# Patient Record
Sex: Female | Born: 1990 | Race: Black or African American | Hispanic: No | Marital: Married | Smoking: Never smoker
Health system: Southern US, Community
[De-identification: ages and names within clinical notes are randomized; demographics above are authoritative.]

## PROBLEM LIST (undated history)

## (undated) ENCOUNTER — Inpatient Hospital Stay (HOSPITAL_COMMUNITY): Payer: Self-pay

## (undated) DIAGNOSIS — Z789 Other specified health status: Secondary | ICD-10-CM

## (undated) HISTORY — DX: Other specified health status: Z78.9

---

## 2013-02-15 NOTE — L&D Delivery Note (Signed)
Delivery Note Went to complete dilation and was found to be crowning by RN.   At 2:03 PM a viable and healthy female was delivered via NSVD,  (Presentation LOA  APGAR: 8,9 ; weight: to be determined  Placenta status: intact, 3 vessel Cord, with no complications   Anesthesia:  None Episiotomy:  None Lacerations:  None Suture Repair: None Est. Blood Loss (mL):  200cc  Mom to postpartum.  Baby to Couplet care / Skin to Skin.    Benjamin Stainhompson, McKenzie L, MD 01/26/2014, 2:27 PM   Attended by me also Agree with note Baby born completely in the caul. Courtney SignsMarie L Dennie Howard, CNM

## 2013-10-15 ENCOUNTER — Inpatient Hospital Stay (HOSPITAL_COMMUNITY)
Admission: AD | Admit: 2013-10-15 | Discharge: 2013-10-15 | Disposition: A | Discharge status: Home / Self Care | Payer: Medicaid Other | Source: Ambulatory Visit | Attending: Obstetrics and Gynecology | Admitting: Obstetrics and Gynecology

## 2013-10-15 ENCOUNTER — Encounter (HOSPITAL_COMMUNITY): Payer: Self-pay | Admitting: *Deleted

## 2013-10-15 DIAGNOSIS — R109 Unspecified abdominal pain: Secondary | ICD-10-CM | POA: Diagnosis present

## 2013-10-15 DIAGNOSIS — Z3482 Encounter for supervision of other normal pregnancy, second trimester: Secondary | ICD-10-CM

## 2013-10-15 DIAGNOSIS — R1084 Generalized abdominal pain: Secondary | ICD-10-CM

## 2013-10-15 DIAGNOSIS — O0932 Supervision of pregnancy with insufficient antenatal care, second trimester: Secondary | ICD-10-CM

## 2013-10-15 DIAGNOSIS — O9989 Other specified diseases and conditions complicating pregnancy, childbirth and the puerperium: Principal | ICD-10-CM

## 2013-10-15 DIAGNOSIS — O0930 Supervision of pregnancy with insufficient antenatal care, unspecified trimester: Secondary | ICD-10-CM | POA: Diagnosis not present

## 2013-10-15 DIAGNOSIS — O99891 Other specified diseases and conditions complicating pregnancy: Secondary | ICD-10-CM | POA: Insufficient documentation

## 2013-10-15 LAB — URINALYSIS, ROUTINE W REFLEX MICROSCOPIC
Bilirubin Urine: NEGATIVE
Glucose, UA: NEGATIVE mg/dL
Hgb urine dipstick: NEGATIVE
Ketones, ur: NEGATIVE mg/dL
Leukocytes, UA: NEGATIVE
NITRITE: NEGATIVE
PH: 7 (ref 5.0–8.0)
Protein, ur: NEGATIVE mg/dL
Urobilinogen, UA: 0.2 mg/dL (ref 0.0–1.0)

## 2013-10-15 LAB — POCT PREGNANCY, URINE: PREG TEST UR: POSITIVE — AB

## 2013-10-15 NOTE — MAU Note (Signed)
5 months pregnant, no prenatal care.  L mid abd pain since 2011, worse with pregnancy, denies bleeding or LOF.

## 2013-10-15 NOTE — Discharge Instructions (Signed)
Please schedule an appointment at one of the listed clinics for routine prenatal care.

## 2013-10-15 NOTE — MAU Provider Note (Signed)
  History     CSN: [id number]  Arrival date and time: 10/15/13 1123   None     Chief Complaint  Patient presents with  . Abdominal Pain   HPI 23 year old G3P2002 at around 24.3 by last menstrual period who presents to the MAU to have her baby "checked on." Worried that the baby is low. History is obtained through an interpreter phone. She recently moved to the [place] and has not established prenatal care yet. Today she feels well and only complains of left side pain that she has had for over 4 years - unchanged from prior. +Fetal movement. Denies contractions, vaginal bleeding, loss of fluid or other concerning symptoms.    OB History   Grav Para Term Preterm Abortions TAB SAB Ect Mult Living   No past medical history on file.  Past Surgical History  Procedure Laterality Date  . Cesarean section      No family history on file.  History  Substance Use Topics  . Smoking status: Not on file  . Smokeless tobacco: Not on file  . Alcohol Use: Not on file    Allergies: No Known Allergies  No prescriptions prior to admission    Review of Systems  Constitutional: Negative.   HENT: Negative.   Eyes: Negative.   Respiratory: Negative.   Cardiovascular: Negative.   Gastrointestinal: Negative.   Genitourinary: Negative.   Musculoskeletal: Positive for back pain. Negative for falls and joint pain.  Skin: Negative.   Neurological: Negative.   Psychiatric/Behavioral: Negative.   All other systems reviewed and are negative.  Negative except as listed in HPI.  Physical Exam   Blood pressure 100/53, pulse 77, temperature 98.2 F (36.8 C), temperature source Oral, resp. rate 20, last menstrual period 04/20/2013.  Physical Exam  Nursing note and vitals reviewed. Constitutional: She is oriented to person, place, and time. She appears well-developed and well-nourished. No distress.  Eyes: Conjunctivae are normal.  Cardiovascular: Normal rate.    Respiratory: Effort normal.  GI: Soft. She exhibits no distension. There is no tenderness. There is no rebound and no guarding.  Genitourinary: Vagina normal.  SVE: Closed/Thick/High  Neurological: She is alert and oriented to person, place, and time.  Skin: Skin is warm and dry.  Psychiatric: She has a normal mood and affect. Her behavior is normal.    MAU Course  Procedures  MDM NST reactive  Assessment and Plan  23 year old G3P2002 at around 24 weeks here for pregnancy check. She has not yet established care and wants to make sure the baby is okay. NST reactive today. SVE closed/thick/high.  - Safe for discharge to home.  - Provided patient with resources on establishing prenatal care.   William Dalton 10/15/2013, 1:41 PM

## 2013-10-15 NOTE — MAU Note (Signed)
Her baby feels "low" and left sided pain x 4 years. Has no prenatal care.

## 2013-10-30 ENCOUNTER — Ambulatory Visit (INDEPENDENT_AMBULATORY_CARE_PROVIDER_SITE_OTHER): Payer: Medicaid Other | Admitting: Obstetrics & Gynecology

## 2013-10-30 ENCOUNTER — Other Ambulatory Visit (HOSPITAL_COMMUNITY)
Admission: RE | Admit: 2013-10-30 | Discharge: 2013-10-30 | Disposition: A | Discharge status: Home / Self Care | Payer: Medicaid Other | Source: Ambulatory Visit | Attending: Obstetrics & Gynecology | Admitting: Obstetrics & Gynecology

## 2013-10-30 ENCOUNTER — Encounter: Payer: Self-pay | Admitting: Obstetrics & Gynecology

## 2013-10-30 VITALS — BP 106/55 | HR 90 | Temp 98.6°F | Ht 64.76 in | Wt 146.7 lb

## 2013-10-30 DIAGNOSIS — Z3493 Encounter for supervision of normal pregnancy, unspecified, third trimester: Secondary | ICD-10-CM | POA: Insufficient documentation

## 2013-10-30 DIAGNOSIS — Z113 Encounter for screening for infections with a predominantly sexual mode of transmission: Secondary | ICD-10-CM | POA: Diagnosis present

## 2013-10-30 DIAGNOSIS — Z01419 Encounter for gynecological examination (general) (routine) without abnormal findings: Secondary | ICD-10-CM | POA: Insufficient documentation

## 2013-10-30 DIAGNOSIS — Z1159 Encounter for screening for other viral diseases: Secondary | ICD-10-CM

## 2013-10-30 DIAGNOSIS — O0930 Supervision of pregnancy with insufficient antenatal care, unspecified trimester: Secondary | ICD-10-CM

## 2013-10-30 DIAGNOSIS — Z118 Encounter for screening for other infectious and parasitic diseases: Secondary | ICD-10-CM

## 2013-10-30 DIAGNOSIS — O34219 Maternal care for unspecified type scar from previous cesarean delivery: Secondary | ICD-10-CM

## 2013-10-30 DIAGNOSIS — Z124 Encounter for screening for malignant neoplasm of cervix: Secondary | ICD-10-CM

## 2013-10-30 LAB — POCT URINALYSIS DIP (DEVICE)
Bilirubin Urine: NEGATIVE
GLUCOSE, UA: NEGATIVE mg/dL
HGB URINE DIPSTICK: NEGATIVE
KETONES UR: NEGATIVE mg/dL
Leukocytes, UA: NEGATIVE
Nitrite: NEGATIVE
Protein, ur: NEGATIVE mg/dL
SPECIFIC GRAVITY, URINE: 1.015 (ref 1.005–1.030)
UROBILINOGEN UA: 1 mg/dL (ref 0.0–1.0)
pH: 8.5 — ABNORMAL HIGH (ref 5.0–8.0)

## 2013-10-30 NOTE — Progress Notes (Signed)
Initial visit today. New OB labs, pap and 1hr gtt today-- to be drawn at 1456.  Patient unable to report pregravid weight.  Reports occasional pelvic pressure. C/o of rash on right hand.  New OB packet given-- patient can read some english.  Discussed flu vaccine-- information sheet in arabic given-- patient to discuss with family and obtain at next visit if she wishes.

## 2013-10-30 NOTE — Patient Instructions (Signed)
Second Trimester of Pregnancy The second trimester is from week 13 through week 28, months 4 through 6. The second trimester is often a time when you feel your best. Your body has also adjusted to being pregnant, and you begin to feel better physically. Usually, morning sickness has lessened or quit completely, you may have more energy, and you may have an increase in appetite. The second trimester is also a time when the fetus is growing rapidly. At the end of the sixth month, the fetus is about 9 inches long and weighs about 1 pounds. You will likely begin to feel the baby move (quickening) between 18 and 20 weeks of the pregnancy. BODY CHANGES Your body goes through many changes during pregnancy. The changes vary from woman to woman.   Your weight will continue to increase. You will notice your lower abdomen bulging out.  You may begin to get stretch marks on your hips, abdomen, and breasts.  You may develop headaches that can be relieved by medicines approved by your health care provider.  You may urinate more often because the fetus is pressing on your bladder.  You may develop or continue to have heartburn as a result of your pregnancy.  You may develop constipation because certain hormones are causing the muscles that push waste through your intestines to slow down.  You may develop hemorrhoids or swollen, bulging veins (varicose veins).  You may have back pain because of the weight gain and pregnancy hormones relaxing your joints between the bones in your pelvis and as a result of a shift in weight and the muscles that support your balance.  Your breasts will continue to grow and be tender.  Your gums may bleed and may be sensitive to brushing and flossing.  Dark spots or blotches (chloasma, mask of pregnancy) may develop on your face. This will likely fade after the baby is born.  A dark line from your belly button to the pubic area (linea nigra) may appear. This will likely fade  after the baby is born.  You may have changes in your hair. These can include thickening of your hair, rapid growth, and changes in texture. Some women also have hair loss during or after pregnancy, or hair that feels dry or thin. Your hair will most likely return to normal after your baby is born. WHAT TO EXPECT AT YOUR PRENATAL VISITS During a routine prenatal visit:  You will be weighed to make sure you and the fetus are growing normally.  Your blood pressure will be taken.  Your abdomen will be measured to track your baby's growth.  The fetal heartbeat will be listened to.  Any test results from the previous visit will be discussed. Your health care provider may ask you:  How you are feeling.  If you are feeling the baby move.  If you have had any abnormal symptoms, such as leaking fluid, bleeding, severe headaches, or abdominal cramping.  If you have any questions. Other tests that may be performed during your second trimester include:  Blood tests that check for:  Low iron levels (anemia).  Gestational diabetes (between 24 and 28 weeks).  Rh antibodies.  Urine tests to check for infections, diabetes, or protein in the urine.  An ultrasound to confirm the proper growth and development of the baby.  An amniocentesis to check for possible genetic problems.  Fetal screens for spina bifida and Down syndrome. HOME CARE INSTRUCTIONS   Avoid all smoking, herbs, alcohol, and unprescribed   drugs. These chemicals affect the formation and growth of the baby.  Follow your health care provider's instructions regarding medicine use. There are medicines that are either safe or unsafe to take during pregnancy.  Exercise only as directed by your health care provider. Experiencing uterine cramps is a good sign to stop exercising.  Continue to eat regular, healthy meals.  Wear a good support bra for breast tenderness.  Do not use hot tubs, steam rooms, or saunas.  Wear your  seat belt at all times when driving.  Avoid raw meat, uncooked cheese, cat litter boxes, and soil used by cats. These carry germs that can cause birth defects in the baby.  Take your prenatal vitamins.  Try taking a stool softener (if your health care provider approves) if you develop constipation. Eat more high-fiber foods, such as fresh vegetables or fruit and whole grains. Drink plenty of fluids to keep your urine clear or pale yellow.  Take warm sitz baths to soothe any pain or discomfort caused by hemorrhoids. Use hemorrhoid cream if your health care provider approves.  If you develop varicose veins, wear support hose. Elevate your feet for 15 minutes, 3-4 times a day. Limit salt in your diet.  Avoid heavy lifting, wear low heel shoes, and practice good posture.  Rest with your legs elevated if you have leg cramps or low back pain.  Visit your dentist if you have not gone yet during your pregnancy. Use a soft toothbrush to brush your teeth and be gentle when you floss.  A sexual relationship may be continued unless your health care provider directs you otherwise.  Continue to go to all your prenatal visits as directed by your health care provider. SEEK MEDICAL CARE IF:   You have dizziness.  You have mild pelvic cramps, pelvic pressure, or nagging pain in the abdominal area.  You have persistent nausea, vomiting, or diarrhea.  You have a bad smelling vaginal discharge.  You have pain with urination. SEEK IMMEDIATE MEDICAL CARE IF:   You have a fever.  You are leaking fluid from your vagina.  You have spotting or bleeding from your vagina.  You have severe abdominal cramping or pain.  You have rapid weight gain or loss.  You have shortness of breath with chest pain.  You notice sudden or extreme swelling of your face, hands, ankles, feet, or legs.  You have not felt your baby move in over an hour.  You have severe headaches that do not go away with  medicine.  You have vision changes. Document Released: 01/26/2001 Document Revised: 02/06/2013 Document Reviewed: 04/04/2012 ExitCare Patient Information 2015 [employer]. This information is not intended to replace advice given to you by your health care provider. Make sure you discuss any questions you have with your health care provider.  

## 2013-10-30 NOTE — Progress Notes (Signed)
Subjective:    Courtney Howard is being seen today for her first obstetrical visit. She is at [redacted]w[redacted]d gestation. Her obstetrical history is significant for previous c-section.  Patient does intend to breast feed. Pregnancy history fully reviewed.  Menstrual History: OB History   Grav Para Term Preterm Abortions TAB SAB Ect Mult Living   Patient's last menstrual period was 04/20/2013.    The following portions of the patient's history were reviewed and updated as appropriate: allergies, current medications, past family history, past medical history, past social history, past surgical history and problem list.  Review of Systems A comprehensive review of systems was negative.    Objective:    BP 106/55  Pulse 90  Temp(Src) 98.6 F (37 C)  Ht 5' 4.76" (1.645 m)  Wt 146 lb 11.2 oz (66.543 kg)  BMI 24.59 kg/m2  LMP 04/20/2013  General Appearance:    Alert, cooperative, no distress, appears stated age                 Neck:   Supple, symmetrical, trachea midline, no adenopathy;    thyroid:  no enlargement/tenderness/nodules; no carotid   bruit or JVD  Back:     Symmetric, no curvature, ROM normal, no CVA tenderness  Lungs:     Clear to auscultation bilaterally, respirations unlabored  Chest Wall:    No tenderness or deformity   Heart:    Regular rate and rhythm, S1 and S2 normal, no murmur, rub   or gallop     Abdomen:     Soft, non-tender, bowel sounds active all four quadrants,    no masses, no organomegaly; gravid FH 27  Genitalia:    Normal female without lesion, discharge or tenderness     Extremities:   Extremities normal, atraumatic, no cyanosis or edema  Pulses:   2+ and symmetric all extremities  Skin:   Skin color, texture, turgor normal, no rashes or lesions  Lymph nodes:   Cervical, supraclavicular, and axillary nodes normal         Assessment:    Pregnancy at 26 and 4/7 weeks  Prev c-section in 1st pregnancy in [place].  S/p successful VBAC  2nd pregnancy    Plan:    Initial labs drawn. Prenatal vitamins. Problem list reviewed and updated. AFP3 discussed: too late. Role of ultrasound in pregnancy discussed; fetal survey: ordered. Amniocentesis discussed: not indicated. Follow up in 4 weeks. 50% of 40 min visit spent on counseling and coordination of care.  1 hour GTT today

## 2013-10-31 ENCOUNTER — Encounter: Payer: Self-pay | Admitting: Obstetrics & Gynecology

## 2013-10-31 LAB — GLUCOSE TOLERANCE, 1 HOUR (50G) W/O FASTING: Glucose, 1 Hour GTT: 121 mg/dL (ref 70–140)

## 2013-10-31 LAB — CYTOLOGY - PAP

## 2013-10-31 LAB — HIV ANTIBODY (ROUTINE TESTING W REFLEX): HIV 1&2 Ab, 4th Generation: NONREACTIVE

## 2013-11-01 LAB — OBSTETRIC PANEL
Antibody Screen: NEGATIVE
BASOS ABS: 0 10*3/uL (ref 0.0–0.1)
Basophils Relative: 0 % (ref 0–1)
Eosinophils Absolute: 0.1 10*3/uL (ref 0.0–0.7)
Eosinophils Relative: 1 % (ref 0–5)
HCT: 30.7 % — ABNORMAL LOW (ref 36.0–46.0)
Hemoglobin: 10.8 g/dL — ABNORMAL LOW (ref 12.0–15.0)
Hepatitis B Surface Ag: NEGATIVE
LYMPHS ABS: 1.8 10*3/uL (ref 0.7–4.0)
LYMPHS PCT: 23 % (ref 12–46)
MCH: 30.5 pg (ref 26.0–34.0)
MCHC: 35.2 g/dL (ref 30.0–36.0)
MCV: 86.7 fL (ref 78.0–100.0)
Monocytes Absolute: 0.7 10*3/uL (ref 0.1–1.0)
Monocytes Relative: 9 % (ref 3–12)
NEUTROS ABS: 5.4 10*3/uL (ref 1.7–7.7)
Neutrophils Relative %: 67 % (ref 43–77)
PLATELETS: 272 10*3/uL (ref 150–400)
RBC: 3.54 MIL/uL — ABNORMAL LOW (ref 3.87–5.11)
RDW: 13 % (ref 11.5–15.5)
Rh Type: POSITIVE
Rubella: 16.4 Index — ABNORMAL HIGH (ref <0.90)
WBC: 8 10*3/uL (ref 4.0–10.5)

## 2013-11-02 LAB — HEMOGLOBINOPATHY EVALUATION
HEMOGLOBIN OTHER: 0 %
HGB F QUANT: 0 % (ref 0.0–2.0)
HGB S QUANTITAION: 0 %
Hgb A2 Quant: 3.3 % — ABNORMAL HIGH (ref 2.2–3.2)
Hgb A: 96.7 % — ABNORMAL LOW (ref 96.8–97.8)

## 2013-11-03 LAB — PRESCRIPTION MONITORING PROFILE (19 PANEL)
Amphetamine/Meth: NEGATIVE ng/mL
Barbiturate Screen, Urine: NEGATIVE ng/mL
Benzodiazepine Screen, Urine: NEGATIVE ng/mL
Buprenorphine, Urine: NEGATIVE ng/mL
CARISOPRODOL, URINE: NEGATIVE ng/mL
COCAINE METABOLITES: NEGATIVE ng/mL
Cannabinoid Scrn, Ur: NEGATIVE ng/mL
Creatinine, Urine: 145.28 mg/dL (ref >20.0)
Fentanyl, Ur: NEGATIVE ng/mL
MDMA URINE: NEGATIVE ng/mL
METHADONE SCREEN, URINE: NEGATIVE ng/mL
METHAQUALONE SCREEN (URINE): NEGATIVE ng/mL
Meperidine, Ur: NEGATIVE ng/mL
NITRITES URINE, INITIAL: NEGATIVE ug/mL
Opiate Screen, Urine: NEGATIVE ng/mL
Oxycodone Screen, Ur: NEGATIVE ng/mL
PH URINE, INITIAL: 7.9 pH (ref 4.5–8.9)
PROPOXYPHENE: NEGATIVE ng/mL
Phencyclidine, Ur: NEGATIVE ng/mL
TRAMADOL UR: NEGATIVE ng/mL
Tapentadol, urine: NEGATIVE ng/mL
ZOLPIDEM, URINE: NEGATIVE ng/mL

## 2013-11-03 LAB — CULTURE, OB URINE

## 2013-11-05 ENCOUNTER — Other Ambulatory Visit: Payer: Self-pay | Admitting: Obstetrics & Gynecology

## 2013-11-05 DIAGNOSIS — O34219 Maternal care for unspecified type scar from previous cesarean delivery: Secondary | ICD-10-CM

## 2013-11-05 DIAGNOSIS — O2343 Unspecified infection of urinary tract in pregnancy, third trimester: Secondary | ICD-10-CM

## 2013-11-05 MED ORDER — AMOXICILLIN 500 MG PO CAPS: 500.0000 mg | ORAL_CAPSULE | Freq: Three times a day (TID) | ORAL | Status: DC

## 2013-11-06 NOTE — Progress Notes (Addendum)
Courtney Howard with female Courtney Howard [phone] ( speaks Arabic or [place])- left message we are calling with information- please call clinic. Voicemail message was in Spanish- so not sure if we have correct number for patient.  No other number left 9/22  [phone]  Pt has [place] on 9/24. Note attached to that visit instructing them to send pt to clinic after US exam.  Diane Day RNC

## 2013-11-08 ENCOUNTER — Ambulatory Visit (HOSPITAL_COMMUNITY): Payer: Medicaid Other

## 2013-11-09 ENCOUNTER — Telehealth: Payer: Self-pay | Admitting: *Deleted

## 2013-11-09 DIAGNOSIS — O2343 Unspecified infection of urinary tract in pregnancy, third trimester: Secondary | ICD-10-CM

## 2013-11-09 MED ORDER — AMOXICILLIN 500 MG PO CAPS: 500.0000 mg | ORAL_CAPSULE | Freq: Three times a day (TID) | ORAL | Status: DC

## 2013-11-09 NOTE — Telephone Encounter (Signed)
Called pt w/[employer] # [phone]. I informed her of bladder infection which requires antibiotic treatment. There is no pharmacy information on file for pt. After quite awhile, pt was able to tell me the telephone number of her pharmacy. I sent Rx and pt's husband will pick up today. Pt also had questions about her other lab test results. I reviewed them and told her that all were normal except for the urine test. Pt had multiple questions which I answered to her satisfaction.  She voiced understanding of all information given. Total time spent on this call was 25 minutes.

## 2013-11-13 ENCOUNTER — Ambulatory Visit (HOSPITAL_COMMUNITY)
Admission: RE | Admit: 2013-11-13 | Discharge: 2013-11-13 | Disposition: A | Discharge status: Home / Self Care | Payer: Medicaid Other | Source: Ambulatory Visit | Attending: Obstetrics & Gynecology | Admitting: Obstetrics & Gynecology

## 2013-11-13 DIAGNOSIS — Z1389 Encounter for screening for other disorder: Secondary | ICD-10-CM

## 2013-11-13 DIAGNOSIS — O34219 Maternal care for unspecified type scar from previous cesarean delivery: Secondary | ICD-10-CM | POA: Diagnosis not present

## 2013-11-13 DIAGNOSIS — O0930 Supervision of pregnancy with insufficient antenatal care, unspecified trimester: Secondary | ICD-10-CM | POA: Diagnosis not present

## 2013-11-13 DIAGNOSIS — Z363 Encounter for antenatal screening for malformations: Secondary | ICD-10-CM | POA: Insufficient documentation

## 2013-11-28 ENCOUNTER — Encounter: Payer: Self-pay | Admitting: *Deleted

## 2013-11-28 ENCOUNTER — Encounter: Payer: Self-pay | Admitting: Family

## 2013-11-28 ENCOUNTER — Ambulatory Visit (INDEPENDENT_AMBULATORY_CARE_PROVIDER_SITE_OTHER): Payer: Medicaid Other | Admitting: Family

## 2013-11-28 VITALS — BP 108/56 | HR 86 | Wt 151.9 lb

## 2013-11-28 DIAGNOSIS — Z3483 Encounter for supervision of other normal pregnancy, third trimester: Secondary | ICD-10-CM

## 2013-11-28 DIAGNOSIS — Z23 Encounter for immunization: Secondary | ICD-10-CM

## 2013-11-28 DIAGNOSIS — Z3493 Encounter for supervision of normal pregnancy, unspecified, third trimester: Secondary | ICD-10-CM

## 2013-11-28 DIAGNOSIS — O34219 Maternal care for unspecified type scar from previous cesarean delivery: Secondary | ICD-10-CM | POA: Insufficient documentation

## 2013-11-28 DIAGNOSIS — O3421 Maternal care for scar from previous cesarean delivery: Secondary | ICD-10-CM

## 2013-11-28 LAB — POCT URINALYSIS DIP (DEVICE)
Bilirubin Urine: NEGATIVE
Glucose, UA: NEGATIVE mg/dL
HGB URINE DIPSTICK: NEGATIVE
Ketones, ur: NEGATIVE mg/dL
Leukocytes, UA: NEGATIVE
NITRITE: NEGATIVE
PH: 7 (ref 5.0–8.0)
Protein, ur: NEGATIVE mg/dL
Specific Gravity, Urine: 1.015 (ref 1.005–1.030)
Urobilinogen, UA: 0.2 mg/dL (ref 0.0–1.0)

## 2013-11-28 MED ORDER — TETANUS-DIPHTH-ACELL PERTUSSIS 5-2.5-18.5 LF-MCG/0.5 IM SUSP: 0.5000 mL | Freq: Once | INTRAMUSCULAR | Status: DC

## 2013-11-28 NOTE — Progress Notes (Signed)
Reviewed lab results and ultrasound.  No questions or concerns.  Desires TOLAC.  Reviewed consent with interpreter and signed.  Tdap and flu vaccine today.

## 2013-12-12 ENCOUNTER — Ambulatory Visit (INDEPENDENT_AMBULATORY_CARE_PROVIDER_SITE_OTHER): Payer: Medicaid Other | Admitting: Advanced Practice Midwife

## 2013-12-12 VITALS — BP 121/68 | HR 79 | Temp 98.5°F | Wt 152.0 lb

## 2013-12-12 DIAGNOSIS — Z3483 Encounter for supervision of other normal pregnancy, third trimester: Secondary | ICD-10-CM

## 2013-12-12 DIAGNOSIS — Z3493 Encounter for supervision of normal pregnancy, unspecified, third trimester: Secondary | ICD-10-CM

## 2013-12-12 LAB — POCT URINALYSIS DIP (DEVICE)
BILIRUBIN URINE: NEGATIVE
Glucose, UA: NEGATIVE mg/dL
Hgb urine dipstick: NEGATIVE
Ketones, ur: NEGATIVE mg/dL
Leukocytes, UA: NEGATIVE
Nitrite: NEGATIVE
PH: 7.5 (ref 5.0–8.0)
PROTEIN: NEGATIVE mg/dL
SPECIFIC GRAVITY, URINE: 1.015 (ref 1.005–1.030)
Urobilinogen, UA: 0.2 mg/dL (ref 0.0–1.0)

## 2013-12-12 NOTE — Progress Notes (Signed)
ManheL Ahmed present as interpreter: Programmer, multimediaLanguage resource

## 2013-12-12 NOTE — Progress Notes (Signed)
Reviewed NOB labs and anatomy US. No concerns.

## 2013-12-12 NOTE — Patient Instructions (Signed)
Preterm Labor Information Preterm labor is when labor starts before you are [redacted] weeks pregnant. The normal length of pregnancy is 39 to 41 weeks.  CAUSES  The cause of preterm labor is not often known. The most common known cause is infection. RISK FACTORS  Having a history of preterm labor.  Having your water break before it should.  Having a placenta that covers the opening of the cervix.  Having a placenta that breaks away from the uterus.  Having a cervix that is too weak to hold the baby in the uterus.  Having too much fluid in the amniotic sac.  Taking drugs or smoking while pregnant.  Not gaining enough weight while pregnant.  Being younger than 18 and older than 21 years old.  Having a low income.  Being African American. SYMPTOMS  Period-like cramps, belly (abdominal) pain, or back pain.  Contractions that are regular, as often as six in an hour. They may be mild or painful.  Contractions that start at the top of the belly. They then move to the lower belly and back.  Lower belly pressure that seems to get stronger.  Bleeding from the vagina.  Fluid leaking from the vagina. TREATMENT  Treatment depends on:  Your condition.  The condition of your baby.  How many weeks pregnant you are. Your doctor may have you:  Take medicine to stop contractions.  Stay in bed except to use the restroom (bed rest).  Stay in the hospital. WHAT SHOULD YOU DO IF YOU THINK YOU ARE IN PRETERM LABOR? Call your doctor right away. You need to go to the hospital right away.  HOW CAN YOU PREVENT PRETERM LABOR IN FUTURE PREGNANCIES?  Stop smoking, if you smoke.  Maintain healthy weight gain.  Do not take drugs or be around chemicals that are not needed.  Tell your doctor if you think you have an infection.  Tell your doctor if you had a preterm labor before. Document Released: 04/30/2008 Document Revised: 11/22/2012 Document Reviewed: 04/30/2008 ExitCare Patient  Information 2015 [employer]. This information is not intended to replace advice given to you by your health care provider. Make sure you discuss any questions you have with your health care provider.  

## 2013-12-18 ENCOUNTER — Encounter: Payer: Self-pay | Admitting: Family

## 2013-12-26 ENCOUNTER — Ambulatory Visit (INDEPENDENT_AMBULATORY_CARE_PROVIDER_SITE_OTHER): Payer: Medicaid Other | Admitting: Advanced Practice Midwife

## 2013-12-26 VITALS — BP 108/51 | HR 86 | Temp 98.3°F | Wt 152.6 lb

## 2013-12-26 DIAGNOSIS — Z3483 Encounter for supervision of other normal pregnancy, third trimester: Secondary | ICD-10-CM

## 2013-12-26 DIAGNOSIS — Z3493 Encounter for supervision of normal pregnancy, unspecified, third trimester: Secondary | ICD-10-CM

## 2013-12-26 LAB — POCT URINALYSIS DIP (DEVICE)
Bilirubin Urine: NEGATIVE
Glucose, UA: NEGATIVE mg/dL
HGB URINE DIPSTICK: NEGATIVE
Ketones, ur: NEGATIVE mg/dL
Leukocytes, UA: NEGATIVE
Nitrite: NEGATIVE
PH: 7 (ref 5.0–8.0)
Protein, ur: NEGATIVE mg/dL
SPECIFIC GRAVITY, URINE: 1.015 (ref 1.005–1.030)
UROBILINOGEN UA: 0.2 mg/dL (ref 0.0–1.0)

## 2013-12-26 NOTE — Progress Notes (Signed)
No concerns. GBS at [place]. PTL precautions.

## 2013-12-26 NOTE — Patient Instructions (Signed)
Braxton Hicks Contractions °Contractions of the uterus can occur throughout pregnancy. Contractions are not always a sign that you are in labor.  °WHAT ARE BRAXTON HICKS CONTRACTIONS?  °Contractions that occur before labor are called Braxton Hicks contractions, or false labor. Toward the end of pregnancy (32-34 weeks), these contractions can develop more often and may become more forceful. This is not true labor because these contractions do not result in opening (dilatation) and thinning of the cervix. They are sometimes difficult to tell apart from true labor because these contractions can be forceful and people have different pain tolerances. You should not feel embarrassed if you go to the hospital with false labor. Sometimes, the only way to tell if you are in true labor is for your health care provider to look for changes in the cervix. °If there are no prenatal problems or other health problems associated with the pregnancy, it is completely safe to be sent home with false labor and await the onset of true labor. °HOW CAN YOU TELL THE DIFFERENCE BETWEEN TRUE AND FALSE LABOR? °False Labor °· The contractions of false labor are usually shorter and not as hard as those of true labor.   °· The contractions are usually irregular.   °· The contractions are often felt in the front of the lower abdomen and in the groin.   °· The contractions may go away when you walk around or change positions while lying down.   °· The contractions get weaker and are shorter lasting as time goes on.   °· The contractions do not usually become progressively stronger, regular, and closer together as with true labor.   °True Labor °· Contractions in true labor last 30-70 seconds, become very regular, usually become more intense, and increase in frequency.   °· The contractions do not go away with walking.   °· The discomfort is usually felt in the top of the uterus and spreads to the lower abdomen and low back.   °· True labor can be  determined by your health care provider with an exam. This will show that the cervix is dilating and getting thinner.   °WHAT TO REMEMBER °· Keep up with your usual exercises and follow other instructions given by your health care provider.   °· Take medicines as directed by your health care provider.   °· Keep your regular prenatal appointments.   °· Eat and drink lightly if you think you are going into labor.   °· If Braxton Hicks contractions are making you uncomfortable:   °¨ Change your position from lying down or resting to walking, or from walking to resting.   °¨ Sit and rest in a tub of warm water.   °¨ Drink 2-3 glasses of water. Dehydration may cause these contractions.   °¨ Do slow and deep breathing several times an hour.   °WHEN SHOULD I SEEK IMMEDIATE MEDICAL CARE? °Seek immediate medical care if: °· Your contractions become stronger, more regular, and closer together.   °· You have fluid leaking or gushing from your vagina.   °· You have a fever.   °· You pass blood-tinged mucus.   °· You have vaginal bleeding.   °· You have continuous abdominal pain.   °· You have low back pain that you never had before.   °· You feel your baby's head pushing down and causing pelvic pressure.   °· Your baby is not moving as much as it used to.   °Document Released: 02/01/2005 Document Revised: 02/06/2013 Document Reviewed: 11/13/2012 °ExitCare® Patient Information ©2015 [employer]. This information is not intended to replace advice given to you by your health care   provider. Make sure you discuss any questions you have with your health care provider. ° °

## 2013-12-26 NOTE — Progress Notes (Signed)
Courtney Howard present for interpreter

## 2014-01-02 ENCOUNTER — Ambulatory Visit (INDEPENDENT_AMBULATORY_CARE_PROVIDER_SITE_OTHER): Payer: Medicaid Other | Admitting: Advanced Practice Midwife

## 2014-01-02 VITALS — BP 101/54 | HR 97 | Temp 98.6°F | Wt 156.5 lb

## 2014-01-02 DIAGNOSIS — O34219 Maternal care for unspecified type scar from previous cesarean delivery: Secondary | ICD-10-CM

## 2014-01-02 DIAGNOSIS — Z3483 Encounter for supervision of other normal pregnancy, third trimester: Secondary | ICD-10-CM

## 2014-01-02 DIAGNOSIS — O3421 Maternal care for scar from previous cesarean delivery: Secondary | ICD-10-CM

## 2014-01-02 DIAGNOSIS — Z3493 Encounter for supervision of normal pregnancy, unspecified, third trimester: Secondary | ICD-10-CM

## 2014-01-02 LAB — POCT URINALYSIS DIP (DEVICE)
Bilirubin Urine: NEGATIVE
GLUCOSE, UA: NEGATIVE mg/dL
HGB URINE DIPSTICK: NEGATIVE
Ketones, ur: NEGATIVE mg/dL
Leukocytes, UA: NEGATIVE
Nitrite: NEGATIVE
PH: 7 (ref 5.0–8.0)
PROTEIN: NEGATIVE mg/dL
SPECIFIC GRAVITY, URINE: 1.015 (ref 1.005–1.030)
UROBILINOGEN UA: 0.2 mg/dL (ref 0.0–1.0)

## 2014-01-02 NOTE — Progress Notes (Signed)
Used interpreter Clemens CatholicSarra Gabralla.

## 2014-01-02 NOTE — Progress Notes (Signed)
Arabic interpreter Clemens CatholicSarra Gabralla used for all communication.  Pt doing well.  Good fetal movement, denies vaginal bleeding, LOF, regular contractions.  Reviewed signs of labor/warning signs/reasons to come to hospital.  Anticipatory guidance given about GBS/GCC at next visit.

## 2014-01-16 ENCOUNTER — Ambulatory Visit (INDEPENDENT_AMBULATORY_CARE_PROVIDER_SITE_OTHER): Payer: Medicaid Other | Admitting: Physician Assistant

## 2014-01-16 ENCOUNTER — Other Ambulatory Visit: Payer: Self-pay | Admitting: Physician Assistant

## 2014-01-16 ENCOUNTER — Encounter: Payer: Medicaid Other | Admitting: Advanced Practice Midwife

## 2014-01-16 VITALS — BP 116/67 | HR 91 | Temp 98.4°F | Wt 159.4 lb

## 2014-01-16 DIAGNOSIS — Z3483 Encounter for supervision of other normal pregnancy, third trimester: Secondary | ICD-10-CM

## 2014-01-16 LAB — POCT URINALYSIS DIP (DEVICE)
Bilirubin Urine: NEGATIVE
GLUCOSE, UA: NEGATIVE mg/dL
Hgb urine dipstick: NEGATIVE
Ketones, ur: NEGATIVE mg/dL
Leukocytes, UA: NEGATIVE
Nitrite: NEGATIVE
PH: 7 (ref 5.0–8.0)
Protein, ur: NEGATIVE mg/dL
SPECIFIC GRAVITY, URINE: 1.01 (ref 1.005–1.030)
Urobilinogen, UA: 0.2 mg/dL (ref 0.0–1.0)

## 2014-01-16 LAB — OB RESULTS CONSOLE GBS: STREP GROUP B AG: NEGATIVE

## 2014-01-16 LAB — OB RESULTS CONSOLE GC/CHLAMYDIA
Chlamydia: NEGATIVE
GC PROBE AMP, GENITAL: NEGATIVE

## 2014-01-16 NOTE — Progress Notes (Signed)
37 weeks, stable without complaint.  Endorses good fetal movement.  Denies LOF, vaginal bleeding, dysuria.   PNV qd Labor precautions RTC 1 week

## 2014-01-16 NOTE — Patient Instructions (Signed)
Third Trimester of Pregnancy The third trimester is from week 29 through week 42, months 7 through 9. The third trimester is a time when the fetus is growing rapidly. At the end of the ninth month, the fetus is about 20 inches in length and weighs 6-10 pounds.  BODY CHANGES Your body goes through many changes during pregnancy. The changes vary from woman to woman.   Your weight will continue to increase. You can expect to gain 25-35 pounds (11-16 kg) by the end of the pregnancy.  You may begin to get stretch marks on your hips, abdomen, and breasts.  You may urinate more often because the fetus is moving lower into your pelvis and pressing on your bladder.  You may develop or continue to have heartburn as a result of your pregnancy.  You may develop constipation because certain hormones are causing the muscles that push waste through your intestines to slow down.  You may develop hemorrhoids or swollen, bulging veins (varicose veins).  You may have pelvic pain because of the weight gain and pregnancy hormones relaxing your joints between the bones in your pelvis. Backaches may result from overexertion of the muscles supporting your posture.  You may have changes in your hair. These can include thickening of your hair, rapid growth, and changes in texture. Some women also have hair loss during or after pregnancy, or hair that feels dry or thin. Your hair will most likely return to normal after your baby is born.  Your breasts will continue to grow and be tender. A yellow discharge may leak from your breasts called colostrum.  Your belly button may stick out.  You may feel short of breath because of your expanding uterus.  You may notice the fetus "dropping," or moving lower in your abdomen.  You may have a bloody mucus discharge. This usually occurs a few days to a week before labor begins.  Your cervix becomes thin and soft (effaced) near your due date. WHAT TO EXPECT AT YOUR PRENATAL  EXAMS  You will have prenatal exams every 2 weeks until week 36. Then, you will have weekly prenatal exams. During a routine prenatal visit:  You will be weighed to make sure you and the fetus are growing normally.  Your blood pressure is taken.  Your abdomen will be measured to track your baby's growth.  The fetal heartbeat will be listened to.  Any test results from the previous visit will be discussed.  You may have a cervical check near your due date to see if you have effaced. At around 36 weeks, your caregiver will check your cervix. At the same time, your caregiver will also perform a test on the secretions of the vaginal tissue. This test is to determine if a type of bacteria, Group B streptococcus, is present. Your caregiver will explain this further. Your caregiver may ask you:  What your birth plan is.  How you are feeling.  If you are feeling the baby move.  If you have had any abnormal symptoms, such as leaking fluid, bleeding, severe headaches, or abdominal cramping.  If you have any questions. Other tests or screenings that may be performed during your third trimester include:  Blood tests that check for low iron levels (anemia).  Fetal testing to check the health, activity level, and growth of the fetus. Testing is done if you have certain medical conditions or if there are problems during the pregnancy. FALSE LABOR You may feel small, irregular contractions that   eventually go away. These are called Braxton Hicks contractions, or false labor. Contractions may last for hours, days, or even weeks before true labor sets in. If contractions come at regular intervals, intensify, or become painful, it is best to be seen by your caregiver.  SIGNS OF LABOR   Menstrual-like cramps.  Contractions that are 5 minutes apart or less.  Contractions that start on the top of the uterus and spread down to the lower abdomen and back.  A sense of increased pelvic pressure or back  pain.  A watery or bloody mucus discharge that comes from the vagina. If you have any of these signs before the 37th week of pregnancy, call your caregiver right away. You need to go to the hospital to get checked immediately. HOME CARE INSTRUCTIONS   Avoid all smoking, herbs, alcohol, and unprescribed drugs. These chemicals affect the formation and growth of the baby.  Follow your caregiver's instructions regarding medicine use. There are medicines that are either safe or unsafe to take during pregnancy.  Exercise only as directed by your caregiver. Experiencing uterine cramps is a good sign to stop exercising.  Continue to eat regular, healthy meals.  Wear a good support bra for breast tenderness.  Do not use hot tubs, steam rooms, or saunas.  Wear your seat belt at all times when driving.  Avoid raw meat, uncooked cheese, cat litter boxes, and soil used by cats. These carry germs that can cause birth defects in the baby.  Take your prenatal vitamins.  Try taking a stool softener (if your caregiver approves) if you develop constipation. Eat more high-fiber foods, such as fresh vegetables or fruit and whole grains. Drink plenty of fluids to keep your urine clear or pale yellow.  Take warm sitz baths to soothe any pain or discomfort caused by hemorrhoids. Use hemorrhoid cream if your caregiver approves.  If you develop varicose veins, wear support hose. Elevate your feet for 15 minutes, 3-4 times a day. Limit salt in your diet.  Avoid heavy lifting, wear low heal shoes, and practice good posture.  Rest a lot with your legs elevated if you have leg cramps or low back pain.  Visit your dentist if you have not gone during your pregnancy. Use a soft toothbrush to brush your teeth and be gentle when you floss.  A sexual relationship may be continued unless your caregiver directs you otherwise.  Do not travel far distances unless it is absolutely necessary and only with the approval  of your caregiver.  Take prenatal classes to understand, practice, and ask questions about the labor and delivery.  Make a trial run to the hospital.  Pack your hospital bag.  Prepare the baby's nursery.  Continue to go to all your prenatal visits as directed by your caregiver. SEEK MEDICAL CARE IF:  You are unsure if you are in labor or if your water has broken.  You have dizziness.  You have mild pelvic cramps, pelvic pressure, or nagging pain in your abdominal area.  You have persistent nausea, vomiting, or diarrhea.  You have a bad smelling vaginal discharge.  You have pain with urination. SEEK IMMEDIATE MEDICAL CARE IF:   You have a fever.  You are leaking fluid from your vagina.  You have spotting or bleeding from your vagina.  You have severe abdominal cramping or pain.  You have rapid weight loss or gain.  You have shortness of breath with chest pain.  You notice sudden or extreme swelling   of your face, hands, ankles, feet, or legs.  You have not felt your baby move in over an hour.  You have severe headaches that do not go away with medicine.  You have vision changes. Document Released: 01/26/2001 Document Revised: 02/06/2013 Document Reviewed: 04/04/2012 ExitCare Patient Information 2015 [employer]. This information is not intended to replace advice given to you by your health care provider. Make sure you discuss any questions you have with your health care provider.  

## 2014-01-17 LAB — GC/CHLAMYDIA PROBE AMP
CT Probe RNA: NEGATIVE
GC PROBE AMP APTIMA: NEGATIVE

## 2014-01-18 LAB — CULTURE, BETA STREP (GROUP B ONLY)

## 2014-01-24 ENCOUNTER — Ambulatory Visit (INDEPENDENT_AMBULATORY_CARE_PROVIDER_SITE_OTHER): Payer: Medicaid Other | Admitting: Advanced Practice Midwife

## 2014-01-24 VITALS — BP 116/64 | HR 85 | Temp 98.3°F | Wt 160.8 lb

## 2014-01-24 DIAGNOSIS — Z3483 Encounter for supervision of other normal pregnancy, third trimester: Secondary | ICD-10-CM

## 2014-01-24 LAB — POCT URINALYSIS DIP (DEVICE)
Bilirubin Urine: NEGATIVE
GLUCOSE, UA: NEGATIVE mg/dL
HGB URINE DIPSTICK: NEGATIVE
KETONES UR: NEGATIVE mg/dL
Leukocytes, UA: NEGATIVE
Nitrite: NEGATIVE
Protein, ur: NEGATIVE mg/dL
Specific Gravity, Urine: 1.015 (ref 1.005–1.030)
Urobilinogen, UA: 0.2 mg/dL (ref 0.0–1.0)
pH: 7 (ref 5.0–8.0)

## 2014-01-24 NOTE — Progress Notes (Signed)
Doing well.  Good fetal movement, denies vaginal bleeding, LOF, regular contractions.  Reviewed signs of labor/reasons to come to hospital.

## 2014-01-26 ENCOUNTER — Encounter (HOSPITAL_COMMUNITY): Payer: Self-pay

## 2014-01-26 ENCOUNTER — Inpatient Hospital Stay (HOSPITAL_COMMUNITY)
Admission: AD | Admit: 2014-01-26 | Discharge: 2014-01-28 | DRG: 766 | Disposition: A | Discharge status: Home / Self Care | Payer: Medicaid Other | Source: Ambulatory Visit | Attending: Family Medicine | Admitting: Family Medicine

## 2014-01-26 DIAGNOSIS — Z3483 Encounter for supervision of other normal pregnancy, third trimester: Secondary | ICD-10-CM | POA: Diagnosis present

## 2014-01-26 DIAGNOSIS — O4290 Premature rupture of membranes, unspecified as to length of time between rupture and onset of labor, unspecified weeks of gestation: Secondary | ICD-10-CM | POA: Diagnosis present

## 2014-01-26 DIAGNOSIS — Z3A39 39 weeks gestation of pregnancy: Secondary | ICD-10-CM | POA: Diagnosis present

## 2014-01-26 DIAGNOSIS — Z3493 Encounter for supervision of normal pregnancy, unspecified, third trimester: Secondary | ICD-10-CM

## 2014-01-26 DIAGNOSIS — O34219 Maternal care for unspecified type scar from previous cesarean delivery: Secondary | ICD-10-CM

## 2014-01-26 LAB — CBC
HCT: 31.1 % — ABNORMAL LOW (ref 36.0–46.0)
Hemoglobin: 10.6 g/dL — ABNORMAL LOW (ref 12.0–15.0)
MCH: 29.3 pg (ref 26.0–34.0)
MCHC: 34.1 g/dL (ref 30.0–36.0)
MCV: 85.9 fL (ref 78.0–100.0)
Platelets: 252 10*3/uL (ref 150–400)
RBC: 3.62 MIL/uL — ABNORMAL LOW (ref 3.87–5.11)
RDW: 13 % (ref 11.5–15.5)
WBC: 9 10*3/uL (ref 4.0–10.5)

## 2014-01-26 LAB — TYPE AND SCREEN
ABO/RH(D): AB POS
Antibody Screen: NEGATIVE

## 2014-01-26 LAB — RPR

## 2014-01-26 LAB — POCT FERN TEST: POCT Fern Test: POSITIVE

## 2014-01-26 LAB — HIV ANTIBODY (ROUTINE TESTING W REFLEX): HIV 1&2 Ab, 4th Generation: NONREACTIVE

## 2014-01-26 LAB — ABO/RH: ABO/RH(D): AB POS

## 2014-01-26 MED ORDER — OXYCODONE-ACETAMINOPHEN 5-325 MG PO TABS: 2.0000 | ORAL_TABLET | ORAL | Status: DC | PRN

## 2014-01-26 MED ORDER — LANOLIN HYDROUS EX OINT: TOPICAL_OINTMENT | CUTANEOUS | Status: DC | PRN

## 2014-01-26 MED ORDER — FENTANYL CITRATE 0.05 MG/ML IJ SOLN
100.0000 ug | INTRAMUSCULAR | Status: DC | PRN
  Filled 2014-01-26: qty 2

## 2014-01-26 MED ORDER — OXYCODONE-ACETAMINOPHEN 5-325 MG PO TABS: 1.0000 | ORAL_TABLET | ORAL | Status: DC | PRN

## 2014-01-26 MED ORDER — ZOLPIDEM TARTRATE 5 MG PO TABS: 5.0000 mg | ORAL_TABLET | Freq: Every evening | ORAL | Status: DC | PRN

## 2014-01-26 MED ORDER — ONDANSETRON HCL 4 MG/2ML IJ SOLN: 4.0000 mg | Freq: Four times a day (QID) | INTRAMUSCULAR | Status: DC | PRN

## 2014-01-26 MED ORDER — OXYTOCIN BOLUS FROM INFUSION: 500.0000 mL | INTRAVENOUS | Status: DC

## 2014-01-26 MED ORDER — TERBUTALINE SULFATE 1 MG/ML IJ SOLN: 0.2500 mg | Freq: Once | INTRAMUSCULAR | Status: DC | PRN

## 2014-01-26 MED ORDER — ACETAMINOPHEN 325 MG PO TABS
650.0000 mg | ORAL_TABLET | ORAL | Status: DC | PRN
Start: 2014-01-26 — End: 2014-01-26

## 2014-01-26 MED ORDER — CITRIC ACID-SODIUM CITRATE 334-500 MG/5ML PO SOLN: 30.0000 mL | ORAL | Status: DC | PRN

## 2014-01-26 MED ORDER — LACTATED RINGERS IV SOLN: 500.0000 mL | INTRAVENOUS | Status: DC | PRN

## 2014-01-26 MED ORDER — LIDOCAINE HCL (PF) 1 % IJ SOLN
30.0000 mL | INTRAMUSCULAR | Status: DC | PRN
  Filled 2014-01-26: qty 30

## 2014-01-26 MED ORDER — OXYTOCIN 40 UNITS IN LACTATED RINGERS INFUSION - SIMPLE MED: 62.5000 mL/h | INTRAVENOUS | Status: DC

## 2014-01-26 MED ORDER — WITCH HAZEL-GLYCERIN EX PADS: 1.0000 "application " | MEDICATED_PAD | CUTANEOUS | Status: DC | PRN

## 2014-01-26 MED ORDER — PRENATAL MULTIVITAMIN CH
1.0000 | ORAL_TABLET | Freq: Every day | ORAL | Status: DC
  Administered 2014-01-27: 1 via ORAL
  Filled 2014-01-26: qty 1

## 2014-01-26 MED ORDER — DIBUCAINE 1 % RE OINT: 1.0000 "application " | TOPICAL_OINTMENT | RECTAL | Status: DC | PRN

## 2014-01-26 MED ORDER — ONDANSETRON HCL 4 MG PO TABS: 4.0000 mg | ORAL_TABLET | ORAL | Status: DC | PRN

## 2014-01-26 MED ORDER — ONDANSETRON HCL 4 MG/2ML IJ SOLN: 4.0000 mg | INTRAMUSCULAR | Status: DC | PRN

## 2014-01-26 MED ORDER — OXYTOCIN 40 UNITS IN LACTATED RINGERS INFUSION - SIMPLE MED
1.0000 m[IU]/min | INTRAVENOUS | Status: DC
  Filled 2014-01-26: qty 1000

## 2014-01-26 MED ORDER — LACTATED RINGERS IV SOLN: INTRAVENOUS | Status: DC

## 2014-01-26 MED ORDER — IBUPROFEN 600 MG PO TABS
600.0000 mg | ORAL_TABLET | Freq: Four times a day (QID) | ORAL | Status: DC
  Administered 2014-01-26 – 2014-01-28 (×7): 600 mg via ORAL
  Filled 2014-01-26 (×7): qty 1

## 2014-01-26 MED ORDER — FENTANYL CITRATE 0.05 MG/ML IJ SOLN: 100.0000 ug | INTRAMUSCULAR | Status: DC | PRN

## 2014-01-26 MED ORDER — BENZOCAINE-MENTHOL 20-0.5 % EX AERO: 1.0000 "application " | INHALATION_SPRAY | CUTANEOUS | Status: DC | PRN

## 2014-01-26 MED ORDER — SIMETHICONE 80 MG PO CHEW: 80.0000 mg | CHEWABLE_TABLET | ORAL | Status: DC | PRN

## 2014-01-26 MED ORDER — TETANUS-DIPHTH-ACELL PERTUSSIS 5-2.5-18.5 LF-MCG/0.5 IM SUSP
0.5000 mL | Freq: Once | INTRAMUSCULAR | Status: AC
  Administered 2014-01-27: 0.5 mL via INTRAMUSCULAR
  Filled 2014-01-26: qty 0.5

## 2014-01-26 MED ORDER — OXYTOCIN 40 UNITS IN LACTATED RINGERS INFUSION - SIMPLE MED
1.0000 m[IU]/min | INTRAVENOUS | Status: DC
  Administered 2014-01-26: 2 m[IU]/min via INTRAVENOUS

## 2014-01-26 MED ORDER — DIPHENHYDRAMINE HCL 25 MG PO CAPS: 25.0000 mg | ORAL_CAPSULE | Freq: Four times a day (QID) | ORAL | Status: DC | PRN

## 2014-01-26 MED ORDER — SENNOSIDES-DOCUSATE SODIUM 8.6-50 MG PO TABS
2.0000 | ORAL_TABLET | ORAL | Status: DC
  Administered 2014-01-27 – 2014-01-28 (×2): 2 via ORAL
  Filled 2014-01-26 (×2): qty 2

## 2014-01-26 NOTE — MAU Note (Addendum)
Water broke at " 6 am"  Burgess EstelleYesterday 01-25-2014, clear fluid.  Was unable come to hospital yesterday because taking care of other children and getting someone to drive her here.  Contractions started at midnight.  Denies bleeding.  Baby moving well.

## 2014-01-26 NOTE — H&P (Signed)
Courtney Howard is a 23 y.o. female G3P2002 @ 39.1wks by LMP and confirmed by 28wk U/S presenting for leaking fluid since 0600 12/11. Denies bldg or fever. Reports ctx started after midnight. Was unable to come for evaluation until now due to transportation issues. [facility] and has been remarkable for 1) first baby by C/S and second by VBAC 2) late onset of care at 26wks 3) GBS neg  History OB History    Gravida Para Term Preterm AB TAB SAB Ectopic Multiple Living   3 2 2       2      History reviewed. No pertinent past medical history. Past Surgical History  Procedure Laterality Date  . Cesarean section     Family History: family history is not on file. Social History:  reports that she has never smoked. She has never used smokeless tobacco. She reports that she does not drink alcohol or use illicit drugs.   Prenatal Transfer Tool  Maternal Diabetes: No Genetic Screening: Declined- too late Maternal Ultrasounds/Referrals: Normal Fetal Ultrasounds or other Referrals:  None Maternal Substance Abuse:  No Significant Maternal Medications:  None Significant Maternal Lab Results:  Lab values include: Group B Strep negative Other Comments:  None  ROS    Blood pressure 133/74, pulse 91, temperature 98.4 F (36.9 C), temperature source Oral, resp. rate 18, last menstrual period 04/20/2013. Exam Physical Exam  Constitutional: She is oriented to person, place, and time. She appears well-developed.  HENT:  Head: Normocephalic.  Cardiovascular: Normal rate.   Respiratory: Effort normal.  GI:  EFM 140s, +accels, no decels Ctx irreg 4-528mins  Genitourinary:  Cx 3/100/-2, tight forebag palp  Musculoskeletal: Normal range of motion.  Neurological: She is alert and oriented to person, place, and time.  Skin: Skin is warm and dry.  Psychiatric: She has a normal mood and affect. Her behavior is normal. Thought content normal.    Prenatal labs: ABO, Rh:  AB/POS/-- (09/15 1530) Antibody: NEG (09/15 1530) Rubella: 16.40 (09/15 1530) RPR: NON REAC (09/15 1530)  HBsAg: NEGATIVE (09/15 1530)  HIV: NONREACTIVE (09/15 1530)  GBS: Negative (12/02 0000)   Assessment/Plan: IUP@ 39.1wks Prolonged ROM x 25hrs GBS neg  [facility]- VBAC consent on chart; signed 10/14 Begin Pitocin for IOL  Anticipate SVD  Cam HaiSHAW, Joon Pohle CNM 01/26/2014, 6:59 AM

## 2014-01-26 NOTE — Progress Notes (Signed)
Patient ID: Courtney Howard, female   DOB: 03/04/1990, 23 y.o.   MRN: [id number] Seen also by me  Filed Vitals:   01/26/14 0900 01/26/14 0903 01/26/14 1000 01/26/14 1054  BP: 148/104 130/91 128/65 113/58  Pulse: 98 105 86 92  Temp:   98.3 F (36.8 C)   TempSrc:   Oral   Resp: 20 20 20 20   Height:      Weight:        Dilation: 4 Effacement (%): 100 Station: -3, Ballotable Presentation: Vertex Exam by:: m williams cnm  Head is ballotable, so will not AROM now.  Will start Pitocin per protocol

## 2014-01-26 NOTE — Progress Notes (Signed)
Courtney Howard is a 23 y.o. G3P2002 at 5925w1d by LMP admitted prolonged ROM.   Subjective: Pt states she has a moderate amount of pain.  Otherwise states she is doing well.    Objective: BP 113/58 mmHg  Pulse 92  Temp(Src) 98.3 F (36.8 C) (Oral)  Resp 20  Ht 5\' 4"  (1.626 m)  Wt 72.576 kg (160 lb)  BMI 27.45 kg/m2  LMP 04/20/2013      FHT:  FHR: 145 bpm, variability: moderate,  accelerations:  Present,  decelerations:  Absent UC:   irregular, every 4 minutes SVE:   Dilation: 4 Effacement (%): 100 Station: -3, Ballotable Exam by:: m williams cnm  Labs: Lab Results  Component Value Date   WBC 9.0 01/26/2014   HGB 10.6* 01/26/2014   HCT 31.1* 01/26/2014   MCV 85.9 01/26/2014   PLT 252 01/26/2014    Assessment / Plan: Protracted latent phase pt is making slow progress with augmentation.  Beginning Pitocin now.  Will recheck in 2-3 hours.    Labor: See above Preeclampsia:  no signs or symptoms of toxicity Fetal Wellbeing:  Category I Pain Control:  Fentanyl I/D:  n/a Anticipated MOD:  NSVD  Benjamin Stainhompson, Angelea Penny L, MD PGY1 01/26/2014, 11:03 AM

## 2014-01-27 NOTE — Lactation Note (Signed)
This note was copied from the chart of Courtney Howard. Lactation Consultation Note  Patient Name: Courtney Aileen Fassadiya Aye ZOXWR'UToday's Date: 01/27/2014 Reason for consult: Initial assessment [place] Arabic interpreter not available at this time thru [employer]. Family friend present and Mom consented to family friend helping to interpret. Mom understands some English. Mom reports baby is nursing well, denies questions/concerns. Baby latched at this visit and demonstrated a good rhythmic suck with 1-2 swallows noted. LC encouraged Mom to keep baby closer during BF for more depth with latch. Lactation brochure left for review, advised of OP services and support group. Encouraged to call for questions/concerns.   Maternal Data Has patient been taught Hand Expression?: Yes Does the patient have breastfeeding experience prior to this delivery?: Yes  Feeding Feeding Type: Breast Fed Length of feed: 15 min  LATCH Score/Interventions Latch: Grasps breast easily, tongue down, lips flanged, rhythmical sucking.  Audible Swallowing: A few with stimulation  Type of Nipple: Everted at rest and after stimulation  Comfort (Breast/Nipple): Soft / non-tender     Hold (Positioning): No assistance needed to correctly position infant at breast. Intervention(s): Breastfeeding basics reviewed;Support Pillows;Position options;Skin to skin  LATCH Score: 9  Lactation Tools Discussed/Used WIC Program: Yes   Consult Status Consult Status: Follow-up Date: 01/28/14 Follow-up type: In-patient    Alfred LevinsGranger, Elster Corbello Ann 01/27/2014, 5:55 PM

## 2014-01-27 NOTE — Progress Notes (Signed)
Offered to call interpreter line for patient.  She declines at this time, states "I understand you. I don't have any questions."

## 2014-01-27 NOTE — Discharge Instructions (Signed)

## 2014-01-28 MED ORDER — OXYCODONE-ACETAMINOPHEN 5-325 MG PO TABS: 1.0000 | ORAL_TABLET | ORAL | Status: DC | PRN

## 2014-01-28 MED ORDER — IBUPROFEN 600 MG PO TABS: 600.0000 mg | ORAL_TABLET | Freq: Four times a day (QID) | ORAL | Status: DC

## 2014-01-28 NOTE — Progress Notes (Signed)
UR chart review completed.  

## 2014-01-28 NOTE — Lactation Note (Signed)
This note was copied from the chart of Courtney Aileen Fassadiya Foor. Lactation Consultation Note  [employer] Interpreter [phone].   P3, Ex BF, Mother denies problems or questions. Reviewed engorgement care, monitoring void/stools. Mom encouraged to feed baby 8-12 times/24 hours and with feeding cues for more than 10 min.  Patient Name: Courtney Howard ZHYQM'VToday's Date: 01/28/2014     Maternal Data    Feeding    LATCH Score/Interventions Latch:  (Unable to assess latch 2300-0700 - was not called)                    Lactation Tools Discussed/Used     Consult Status      Hardie PulleyBerkelhammer, Ruth Boschen 01/28/2014, 9:32 AM

## 2014-01-28 NOTE — Discharge Summary (Signed)
Obstetric Discharge Summary Reason for Admission: onset of labor Prenatal Procedures: ultrasound Intrapartum Procedures: spontaneous vaginal delivery Postpartum Procedures: none Complications-Operative and Postpartum: none HEMOGLOBIN  Date Value Ref Range Status  01/26/2014 10.6* 12.0 - 15.0 g/dL Final   HCT  Date Value Ref Range Status  01/26/2014 31.1* 36.0 - 46.0 % Final    Physical Exam:  General: alert, cooperative, appears stated age and no distress Lochia: appropriate Uterine Fundus: firm Incision: n/a DVT Evaluation: No evidence of DVT seen on physical exam. Negative Homan's sign. No cords or calf tenderness.  Discharge Diagnoses: Term Pregnancy-delivered  Discharge Information: Date: 01/28/2014 Activity: pelvic rest Diet: routine Medications: PNV, Ibuprofen and Percocet Condition: stable and improved Instructions: refer to practice specific booklet Discharge to: home Follow-up Information    Follow up with [facility] In 6 weeks.   Contact information:   [address] [place] [id number]       Newborn Data: Live born female  Birth Weight: 7 lb (3175 g) APGAR: 9, 9  Home with mother.  Courtney Howard 01/28/2014, 6:08 AM

## 2014-01-28 NOTE — Progress Notes (Signed)
[employer] interpreter number [phone]

## 2014-01-28 NOTE — Progress Notes (Signed)
Post Partum Day 1 Delayed entry as pt originally wanted to be discharged, however baby's discharge held.  Subjective:  Courtney Howard is a 23 y.o. [id number] 5863w1d s/p SVD.  No acute events overnight.  Pt denies problems with ambulating, voiding or po intake.  She denies nausea or vomiting.  Pain is moderately controlled.  Plan for birth control is undecided, would like to talk to husband about options. Method of Feeding: breast  Objective: Blood pressure 115/62, pulse 87, temperature 98.5 F (36.9 C), temperature source Oral, resp. rate 18, height 5\' 4"  (1.626 m), weight 160 lb (72.576 kg), last menstrual period 04/20/2013, unknown if currently breastfeeding.  Physical Exam:  General: alert, cooperative and no distress Lochia:normal flow Chest: CTAB Heart: RRR no m/r/g Abdomen: +BS, soft, nontender,  Uterine Fundus: firm DVT Evaluation: No evidence of DVT seen on physical exam. Extremities: no edema   Recent Labs  01/26/14 0728  HGB 10.6*  HCT 31.1*    Assessment/Plan:  ASSESSMENT: Courtney Howard is a 23 y.o. [id number] 4563w1d s/p SVD  Plan for discharge tomorrow  LOS: 2 days   Carlton Sweaney ROCIO 01/28/2014, 9:16 AM

## 2014-03-11 ENCOUNTER — Ambulatory Visit: Payer: Medicaid Other | Admitting: Obstetrics & Gynecology

## 2014-03-29 ENCOUNTER — Encounter: Payer: Self-pay | Admitting: Advanced Practice Midwife

## 2014-03-29 ENCOUNTER — Ambulatory Visit (INDEPENDENT_AMBULATORY_CARE_PROVIDER_SITE_OTHER): Payer: Medicaid Other | Admitting: Advanced Practice Midwife

## 2014-03-29 VITALS — BP 119/74 | HR 67 | Temp 99.0°F | Wt 134.7 lb

## 2014-03-29 DIAGNOSIS — K2100 Gastro-esophageal reflux disease with esophagitis, without bleeding: Secondary | ICD-10-CM

## 2014-03-29 DIAGNOSIS — IMO0001 Reserved for inherently not codable concepts without codable children: Secondary | ICD-10-CM

## 2014-03-29 DIAGNOSIS — J069 Acute upper respiratory infection, unspecified: Secondary | ICD-10-CM

## 2014-03-29 DIAGNOSIS — Z8742 Personal history of other diseases of the female genital tract: Secondary | ICD-10-CM

## 2014-03-29 MED ORDER — SUCRALFATE 1 G PO TABS: 1.0000 g | ORAL_TABLET | Freq: Three times a day (TID) | ORAL | Status: DC

## 2014-03-29 MED ORDER — PANTOPRAZOLE SODIUM 20 MG PO TBEC: 20.0000 mg | DELAYED_RELEASE_TABLET | Freq: Every day | ORAL | Status: DC

## 2014-03-29 MED ORDER — RANITIDINE HCL 150 MG PO TABS: 150.0000 mg | ORAL_TABLET | Freq: Two times a day (BID) | ORAL | Status: DC

## 2014-03-29 NOTE — Progress Notes (Signed)
  Subjective:     Courtney Howard is a 24 y.o. female who presents for a postpartum visit. She is 4 weeks postpartum following a spontaneous vaginal delivery. I have fully reviewed the prenatal and intrapartum course. The delivery was at Term. Outcome: spontaneous vaginal delivery. Anesthesia: none. Postpartum course has been Uneventful except for 3 year hx acid reflux/epigastric pain and a current URI with cough. Baby's course has been uneventful. Baby is feeding by breast. Bleeding staining only. Bowel function is normal. Bladder function is normal. Patient is not sexually active. Contraception method is abstinence. Postpartum depression screening: negative.  The following portions of the patient's history were reviewed and updated as appropriate: allergies, current medications, past family history, past medical history, past social history, past surgical history and problem list.  Review of Systems Pertinent items are noted in HPI.   Objective:    BP 119/74 mmHg  Pulse 67  Temp(Src) 99 F (37.2 C) (Oral)  Wt 134 lb 11.2 oz (61.1 kg)  Breastfeeding? Yes  General:  alert, cooperative and no distress   Breasts:  inspection negative, no nipple discharge or bleeding, no masses or nodularity palpable  Lungs: clear to auscultation bilaterally  Heart:  regular rate and rhythm, S1, S2 normal, no murmur, click, rub or gallop  Abdomen: soft, non-tender; bowel sounds normal; no masses,  no organomegaly   Vulva:  not evaluated  Vagina: not evaluated  Cervix:  n/a  Corpus: not examined  Adnexa:  not evaluated  Rectal Exam: Not performed.        Assessment:     Normal postpartum exam. Pap smear not done at today's visit.   Plan:    1. Contraception: none and Given list of choices. Options reviewed. States does not want tu use any method now 2. Discussed acid reflux. Rx Zantac.  Discussed URI care 3. Follow up in: 1 year or as needed.

## 2014-03-29 NOTE — Patient Instructions (Addendum)
Contraception Choices Contraception (birth control) is the use of any methods or devices to prevent pregnancy. Below are some methods to help avoid pregnancy. HORMONAL METHODS   Contraceptive implant. This is a thin, plastic tube containing progesterone hormone. It does not contain estrogen hormone. Your health care provider inserts the tube in the inner part of the upper arm. The tube can remain in place for up to 3 years. After 3 years, the implant must be removed. The implant prevents the ovaries from releasing an egg (ovulation), thickens the cervical mucus to prevent sperm from entering the uterus, and thins the lining of the inside of the uterus.  Progesterone-only injections. These injections are given every 3 months by your health care provider to prevent pregnancy. This synthetic progesterone hormone stops the ovaries from releasing eggs. It also thickens cervical mucus and changes the uterine lining. This makes it harder for sperm to survive in the uterus.  Birth control pills. These pills contain estrogen and progesterone hormone. They work by preventing the ovaries from releasing eggs (ovulation). They also cause the cervical mucus to thicken, preventing the sperm from entering the uterus. Birth control pills are prescribed by a health care provider.Birth control pills can also be used to treat heavy periods.  Minipill. This type of birth control pill contains only the progesterone hormone. They are taken every day of each month and must be prescribed by your health care provider.  Birth control patch. The patch contains hormones similar to those in birth control pills. It must be changed once a week and is prescribed by a health care provider.  Vaginal ring. The ring contains hormones similar to those in birth control pills. It is left in the vagina for 3 weeks, removed for 1 week, and then a new one is put back in place. The patient must be comfortable inserting and removing the ring  from the vagina.A health care provider's prescription is necessary.  Emergency contraception. Emergency contraceptives prevent pregnancy after unprotected sexual intercourse. This pill can be taken right after sex or up to 5 days after unprotected sex. It is most effective the sooner you take the pills after having sexual intercourse. Most emergency contraceptive pills are available without a prescription. Check with your pharmacist. Do not use emergency contraception as your only form of birth control. BARRIER METHODS   Female condom. This is a thin sheath (latex or rubber) that is worn over the penis during sexual intercourse. It can be used with spermicide to increase effectiveness.  Female condom. This is a soft, loose-fitting sheath that is put into the vagina before sexual intercourse.  Diaphragm. This is a soft, latex, dome-shaped barrier that must be fitted by a health care provider. It is inserted into the vagina, along with a spermicidal jelly. It is inserted before intercourse. The diaphragm should be left in the vagina for 6 to 8 hours after intercourse.  Cervical cap. This is a round, soft, latex or plastic cup that fits over the cervix and must be fitted by a health care provider. The cap can be left in place for up to 48 hours after intercourse.  Sponge. This is a soft, circular piece of polyurethane foam. The sponge has spermicide in it. It is inserted into the vagina after wetting it and before sexual intercourse.  Spermicides. These are chemicals that kill or block sperm from entering the cervix and uterus. They come in the form of creams, jellies, suppositories, foam, or tablets. They do not require a   prescription. They are inserted into the vagina with an applicator before having sexual intercourse. The process must be repeated every time you have sexual intercourse. INTRAUTERINE CONTRACEPTION  Intrauterine device (IUD). This is a T-shaped device that is put in a woman's uterus  during a menstrual period to prevent pregnancy. There are 2 types:  Copper IUD. This type of IUD is wrapped in copper wire and is placed inside the uterus. Copper makes the uterus and fallopian tubes produce a fluid that kills sperm. It can stay in place for 10 years.  Hormone IUD. This type of IUD contains the hormone progestin (synthetic progesterone). The hormone thickens the cervical mucus and prevents sperm from entering the uterus, and it also thins the uterine lining to prevent implantation of a fertilized egg. The hormone can weaken or kill the sperm that get into the uterus. It can stay in place for 3-5 years, depending on which type of IUD is used. PERMANENT METHODS OF CONTRACEPTION  Female tubal ligation. This is when the woman's fallopian tubes are surgically sealed, tied, or blocked to prevent the egg from traveling to the uterus.  Hysteroscopic sterilization. This involves placing a small coil or insert into each fallopian tube. Your doctor uses a technique called hysteroscopy to do the procedure. The device causes scar tissue to form. This results in permanent blockage of the fallopian tubes, so the sperm cannot fertilize the egg. It takes about 3 months after the procedure for the tubes to become blocked. You must use another form of birth control for these 3 months.  Female sterilization. This is when the female has the tubes that carry sperm tied off (vasectomy).This blocks sperm from entering the vagina during sexual intercourse. After the procedure, the man can still ejaculate fluid (semen). NATURAL PLANNING METHODS  Natural family planning. This is not having sexual intercourse or using a barrier method (condom, diaphragm, cervical cap) on days the woman could become pregnant.  Calendar method. This is keeping track of the length of each menstrual cycle and identifying when you are fertile.  Ovulation method. This is avoiding sexual intercourse during ovulation.  Symptothermal  method. This is avoiding sexual intercourse during ovulation, using a thermometer and ovulation symptoms.  Post-ovulation method. This is timing sexual intercourse after you have ovulated. Regardless of which type or method of contraception you choose, it is important that you use condoms to protect against the transmission of sexually transmitted infections (STIs). Talk with your health care provider about which form of contraception is most appropriate for you. Document Released: 02/01/2005 Document Revised: 02/06/2013 Document Reviewed: 07/27/2012 [facility] Patient Information 2015 [place], [place]. This information is not intended to replace advice given to you by your health care provider. Make sure you discuss any questions you have with your health care provider. Upper Respiratory Infection, Adult An upper respiratory infection (URI) is also sometimes known as the common cold. The upper respiratory tract includes the nose, sinuses, throat, trachea, and bronchi. Bronchi are the airways leading to the lungs. Most people improve within 1 week, but symptoms can last up to 2 weeks. A residual cough may last even longer.  CAUSES Many different viruses can infect the tissues lining the upper respiratory tract. The tissues become irritated and inflamed and often become very moist. Mucus production is also common. A cold is contagious. You can easily spread the virus to others by oral contact. This includes kissing, sharing a glass, coughing, or sneezing. Touching your mouth or nose and then touching a  surface, which is then touched by another person, can also spread the virus. SYMPTOMS  Symptoms typically develop 1 to 3 days after you come in contact with a cold virus. Symptoms vary from person to person. They may include:  Runny nose.  Sneezing.  Nasal congestion.  Sinus irritation.  Sore throat.  Loss of voice (laryngitis).  Cough.  Fatigue.  Muscle aches.  Loss of  appetite.  Headache.  Low-grade fever. DIAGNOSIS  You might diagnose your own cold based on familiar symptoms, since most people get a cold 2 to 3 times a year. Your caregiver can confirm this based on your exam. Most importantly, your caregiver can check that your symptoms are not due to another disease such as strep throat, sinusitis, pneumonia, asthma, or epiglottitis. Blood tests, throat tests, and X-rays are not necessary to diagnose a common cold, but they may sometimes be helpful in excluding other more serious diseases. Your caregiver will decide if any further tests are required. RISKS AND COMPLICATIONS  You may be at risk for a more severe case of the common cold if you smoke cigarettes, have chronic heart disease (such as heart failure) or lung disease (such as asthma), or if you have a weakened immune system. The very young and very old are also at risk for more serious infections. Bacterial sinusitis, middle ear infections, and bacterial pneumonia can complicate the common cold. The common cold can worsen asthma and chronic obstructive pulmonary disease (COPD). Sometimes, these complications can require emergency medical care and may be life-threatening. PREVENTION  The best way to protect against getting a cold is to practice good hygiene. Avoid oral or hand contact with people with cold symptoms. Wash your hands often if contact occurs. There is no clear evidence that vitamin C, vitamin E, echinacea, or exercise reduces the chance of developing a cold. However, it is always recommended to get plenty of rest and practice good nutrition. TREATMENT  Treatment is directed at relieving symptoms. There is no cure. Antibiotics are not effective, because the infection is caused by a virus, not by bacteria. Treatment may include:  Increased fluid intake. Sports drinks offer valuable electrolytes, sugars, and fluids.  Breathing heated mist or steam (vaporizer or shower).  Eating chicken soup  or other clear broths, and maintaining good nutrition.  Getting plenty of rest.  Using gargles or lozenges for comfort.  Controlling fevers with ibuprofen or acetaminophen as directed by your caregiver.  Increasing usage of your inhaler if you have asthma. Zinc gel and zinc lozenges, taken in the first 24 hours of the common cold, can shorten the duration and lessen the severity of symptoms. Pain medicines may help with fever, muscle aches, and throat pain. A variety of non-prescription medicines are available to treat congestion and runny nose. Your caregiver can make recommendations and may suggest nasal or lung inhalers for other symptoms.  HOME CARE INSTRUCTIONS   Only take over-the-counter or prescription medicines for pain, discomfort, or fever as directed by your caregiver.  Use a warm mist humidifier or inhale steam from a shower to increase air moisture. This may keep secretions moist and make it easier to breathe.  Drink enough water and fluids to keep your urine clear or pale yellow.  Rest as needed.  Return to work when your temperature has returned to normal or as your caregiver advises. You may need to stay home longer to avoid infecting others. You can also use a face mask and careful hand washing to prevent  spread of the virus. SEEK MEDICAL CARE IF:   After the first few days, you feel you are getting worse rather than better.  You need your caregiver's advice about medicines to control symptoms.  You develop chills, worsening shortness of breath, or brown or red sputum. These may be signs of pneumonia.  You develop yellow or brown nasal discharge or pain in the face, especially when you bend forward. These may be signs of sinusitis.  You develop a fever, swollen neck glands, pain with swallowing, or white areas in the back of your throat. These may be signs of strep throat. SEEK IMMEDIATE MEDICAL CARE IF:   You have a fever.  You develop severe or persistent  headache, ear pain, sinus pain, or chest pain.  You develop wheezing, a prolonged cough, cough up blood, or have a change in your usual mucus (if you have chronic lung disease).  You develop sore muscles or a stiff neck. Document Released: 07/28/2000 Document Revised: 04/26/2011 Document Reviewed: 05/09/2013 [facility] Patient Information 2015 [place], [place]. This information is not intended to replace advice given to you by your health care provider. Make sure you discuss any questions you have with your health care provider. Gastroesophageal Reflux Disease, Adult Gastroesophageal reflux disease (GERD) happens when acid from your stomach flows up into the esophagus. When acid comes in contact with the esophagus, the acid causes soreness (inflammation) in the esophagus. Over time, GERD may create small holes (ulcers) in the lining of the esophagus. CAUSES   Increased body weight. This puts pressure on the stomach, making acid rise from the stomach into the esophagus.  Smoking. This increases acid production in the stomach.  Drinking alcohol. This causes decreased pressure in the lower esophageal sphincter (valve or ring of muscle between the esophagus and stomach), allowing acid from the stomach into the esophagus.  Late evening meals and a full stomach. This increases pressure and acid production in the stomach.  A malformed lower esophageal sphincter. Sometimes, no cause is found. SYMPTOMS   Burning pain in the lower part of the mid-chest behind the breastbone and in the mid-stomach area. This may occur twice a week or more often.  Trouble swallowing.  Sore throat.  Dry cough.  Asthma-like symptoms including chest tightness, shortness of breath, or wheezing. DIAGNOSIS  Your caregiver may be able to diagnose GERD based on your symptoms. In some cases, X-rays and other tests may be done to check for complications or to check the condition of your stomach and esophagus. TREATMENT   Your caregiver may recommend over-the-counter or prescription medicines to help decrease acid production. Ask your caregiver before starting or adding any new medicines.  HOME CARE INSTRUCTIONS   Change the factors that you can control. Ask your caregiver for guidance concerning weight loss, quitting smoking, and alcohol consumption.  Avoid foods and drinks that make your symptoms worse, such as:  Caffeine or alcoholic drinks.  Chocolate.  Peppermint or mint flavorings.  Garlic and onions.  Spicy foods.  Citrus fruits, such as oranges, lemons, or limes.  Tomato-based foods such as sauce, chili, salsa, and pizza.  Fried and fatty foods.  Avoid lying down for the 3 hours prior to your bedtime or prior to taking a nap.  Eat small, frequent meals instead of large meals.  Wear loose-fitting clothing. Do not wear anything tight around your waist that causes pressure on your stomach.  Raise the head of your bed 6 to 8 inches with wood blocks to help  you sleep. Extra pillows will not help.  Only take over-the-counter or prescription medicines for pain, discomfort, or fever as directed by your caregiver.  Do not take aspirin, ibuprofen, or other nonsteroidal anti-inflammatory drugs (NSAIDs). SEEK IMMEDIATE MEDICAL CARE IF:   You have pain in your arms, neck, jaw, teeth, or back.  Your pain increases or changes in intensity or duration.  You develop nausea, vomiting, or sweating (diaphoresis).  You develop shortness of breath, or you faint.  Your vomit is green, yellow, black, or looks like coffee grounds or blood.  Your stool is red, bloody, or black. These symptoms could be signs of other problems, such as heart disease, gastric bleeding, or esophageal bleeding. MAKE SURE YOU:   Understand these instructions.  Will watch your condition.  Will get help right away if you are not doing well or get worse. Document Released: 11/11/2004 Document Revised: 04/26/2011 Document  Reviewed: 08/21/2010 [facility] Patient Information 2015 [place], [place]. This information is not intended to replace advice given to you by your health care provider. Make sure you discuss any questions you have with your health care provider.

## 2014-11-20 ENCOUNTER — Encounter (HOSPITAL_COMMUNITY): Payer: Self-pay | Admitting: Neurology

## 2014-11-20 ENCOUNTER — Emergency Department (HOSPITAL_COMMUNITY)
Admission: EM | Admit: 2014-11-20 | Discharge: 2014-11-20 | Disposition: A | Discharge status: Home / Self Care | Payer: Medicaid Other | Attending: Emergency Medicine | Admitting: Emergency Medicine

## 2014-11-20 DIAGNOSIS — Z79899 Other long term (current) drug therapy: Secondary | ICD-10-CM | POA: Diagnosis not present

## 2014-11-20 DIAGNOSIS — Z3202 Encounter for pregnancy test, result negative: Secondary | ICD-10-CM | POA: Diagnosis not present

## 2014-11-20 DIAGNOSIS — R1012 Left upper quadrant pain: Secondary | ICD-10-CM | POA: Insufficient documentation

## 2014-11-20 LAB — URINALYSIS, ROUTINE W REFLEX MICROSCOPIC
Bilirubin Urine: NEGATIVE
GLUCOSE, UA: NEGATIVE mg/dL
HGB URINE DIPSTICK: NEGATIVE
KETONES UR: NEGATIVE mg/dL
Leukocytes, UA: NEGATIVE
Nitrite: NEGATIVE
PH: 6 (ref 5.0–8.0)
PROTEIN: NEGATIVE mg/dL
Specific Gravity, Urine: 1.018 (ref 1.005–1.030)
Urobilinogen, UA: 0.2 mg/dL (ref 0.0–1.0)

## 2014-11-20 LAB — COMPREHENSIVE METABOLIC PANEL
ALT: 11 U/L — ABNORMAL LOW (ref 14–54)
ANION GAP: 9 (ref 5–15)
AST: 18 U/L (ref 15–41)
Albumin: 3.9 g/dL (ref 3.5–5.0)
Alkaline Phosphatase: 59 U/L (ref 38–126)
BUN: 8 mg/dL (ref 6–20)
CHLORIDE: 103 mmol/L (ref 101–111)
CO2: 24 mmol/L (ref 22–32)
Calcium: 8.8 mg/dL — ABNORMAL LOW (ref 8.9–10.3)
Creatinine, Ser: 0.46 mg/dL (ref 0.44–1.00)
GFR calc non Af Amer: >60 mL/min (ref >60)
Glucose, Bld: 108 mg/dL — ABNORMAL HIGH (ref 65–99)
POTASSIUM: 4 mmol/L (ref 3.5–5.1)
SODIUM: 136 mmol/L (ref 135–145)
Total Bilirubin: 0.2 mg/dL — ABNORMAL LOW (ref 0.3–1.2)
Total Protein: 7.4 g/dL (ref 6.5–8.1)

## 2014-11-20 LAB — CBC
HCT: 35.8 % — ABNORMAL LOW (ref 36.0–46.0)
HEMOGLOBIN: 12.2 g/dL (ref 12.0–15.0)
MCH: 30 pg (ref 26.0–34.0)
MCHC: 34.1 g/dL (ref 30.0–36.0)
MCV: 88.2 fL (ref 78.0–100.0)
Platelets: 338 10*3/uL (ref 150–400)
RBC: 4.06 MIL/uL (ref 3.87–5.11)
RDW: 12.4 % (ref 11.5–15.5)
WBC: 5.2 10*3/uL (ref 4.0–10.5)

## 2014-11-20 LAB — LIPASE, BLOOD: LIPASE: 31 U/L (ref 22–51)

## 2014-11-20 LAB — I-STAT BETA HCG BLOOD, ED (MC, WL, AP ONLY): I-stat hCG, quantitative: <5 m[IU]/mL (ref <5)

## 2014-11-20 NOTE — ED Provider Notes (Signed)
CSN: [id number]     Arrival date & time 11/20/14  0918 History   First MD Initiated Contact with Patient 11/20/14 0940     Chief Complaint  Patient presents with  . Abdominal Pain     (Consider location/radiation/quality/duration/timing/severity/associated sxs/prior Treatment) HPI Comments: 24 year old healthy female who presents with left upper quadrant abdominal pain. Patient states that she has had intermittent left upper quadrant abdominal pain for the past 3 years. The patient has been worse over the past few days. The pain comes and goes randomly and is not associated with eating. Nothing makes it better or worse. She denies any associated nausea, vomiting, diarrhea, or blood in her stool. No urinary symptoms. No vaginal bleeding or discharge. No fevers or recent illness. No recent international travel.  Patient is a 24 y.o. female presenting with abdominal pain. The history is provided by the patient.  Abdominal Pain   History reviewed. No pertinent past medical history. Past Surgical History  Procedure Laterality Date  . Cesarean section     No family history on file. Social History  Substance Use Topics  . Smoking status: Never Smoker   . Smokeless tobacco: Never Used  . Alcohol Use: No   OB History    Gravida Para Term Preterm AB TAB SAB Ectopic Multiple Living   0 3     Review of Systems  Gastrointestinal: Positive for abdominal pain.    10 Systems reviewed and are negative for acute change except as noted in the HPI.   Allergies  Review of patient's allergies indicates no known allergies.  Home Medications   Prior to Admission medications   Medication Sig Start Date End Date Taking? Authorizing Provider  ibuprofen (ADVIL,MOTRIN) 600 MG tablet Take 1 tablet (600 mg total) by mouth every 6 (six) hours. Patient not taking: Reported on 03/29/2014 01/28/14   Montez Morita, CNM  oxyCODONE-acetaminophen (PERCOCET/ROXICET) 5-325 MG per tablet Take 1  tablet by mouth every 4 (four) hours as needed (for pain scale less than 7). Patient not taking: Reported on 03/29/2014 01/28/14   Montez Morita, CNM  Prenatal Multivit-Min-Fe-FA (PRENATAL VITAMINS PO) Take 1 tablet by mouth daily.    Historical Provider, MD  ranitidine (ZANTAC) 150 MG tablet Take 1 tablet (150 mg total) by mouth 2 (two) times daily. 03/29/14   Aviva Signs, CNM   BP 96/56 mmHg  Pulse 68  Temp(Src) 98.3 F (36.8 C) (Oral)  Resp 18  SpO2 100%  LMP 11/06/2014 Physical Exam  Constitutional: She is oriented to person, place, and time. She appears well-developed and well-nourished. No distress.  HENT:  Head: Normocephalic and atraumatic.  Moist mucous membranes  Eyes: Conjunctivae are normal. Pupils are equal, round, and reactive to light.  Neck: Neck supple.  Cardiovascular: Normal rate, regular rhythm and normal heart sounds.   No murmur heard. Pulmonary/Chest: Effort normal and breath sounds normal.  Abdominal: Soft. Bowel sounds are normal. She exhibits no distension.  Mild left upper quadrant tenderness to deep palpation without obvious splenomegaly; no rebound or guarding  Musculoskeletal: She exhibits no edema.  Neurological: She is alert and oriented to person, place, and time.  Fluent speech  Skin: Skin is warm and dry.  Henna on hands and feet  Psychiatric: She has a normal mood and affect. Judgment normal.  Nursing note and vitals reviewed.   ED Course  Procedures (including critical care time) Labs Review Labs Reviewed  COMPREHENSIVE METABOLIC PANEL -  Abnormal; Notable for the following:    Glucose, Bld 108 (*)    Calcium 8.8 (*)    ALT 11 (*)    Total Bilirubin 0.2 (*)    All other components within normal limits  CBC - Abnormal; Notable for the following:    HCT 35.8 (*)    All other components within normal limits  LIPASE, BLOOD  URINALYSIS, ROUTINE W REFLEX MICROSCOPIC (NOT AT [facility])  I-STAT BETA HCG BLOOD, ED ([facility], [facility], [facility] ONLY)     Imaging Review No results found. I have personally reviewed and evaluated these lab results as part of my medical decision-making.   EKG Interpretation None      MDM   Final diagnoses:  LUQ pain   24 year old female who presents with several years of left upper quadrant pain that has been worse over the past few days. Patient well-appearing with normal vital signs at presentation. No lower abdominal tenderness, mild left upper quadrant tenderness noted. Obtained above labs.  Labwork including CMP, UA, and lipase is unremarkable. Normal WBC count. I performed a bedside ultrasound which showed no splenomegaly. She has no lower abdominal tenderness and given the chronicity of her symptoms, lack of fever, and reassuring labs, I do not feel that she needs any CT imaging at this time. Likelihood of appendicitis or other acute intra-abdominal process is extremely low. Patient states that she has a primary care physician with whom she can follow-up and she has already contacted to make an appointment this month. I have encouraged her to follow-up with PCP and reviewed return precautions including fever, vomiting, blood in her stool, or worsening symptoms. Patient voiced understanding was discharged in satisfactory condition.    Laurence Spates, MD 11/20/14 1213

## 2014-11-20 NOTE — ED Notes (Signed)
Pt reports LUQ abd pain for 3 years, pain is worse for past few days. Denies n/v. Pt is a x 4.

## 2014-11-20 NOTE — Discharge Instructions (Signed)

## 2014-12-04 ENCOUNTER — Ambulatory Visit (INDEPENDENT_AMBULATORY_CARE_PROVIDER_SITE_OTHER): Payer: Medicaid Other | Admitting: Obstetrics & Gynecology

## 2014-12-04 ENCOUNTER — Encounter: Payer: Self-pay | Admitting: Obstetrics & Gynecology

## 2014-12-04 VITALS — BP 113/67 | HR 95 | Temp 98.6°F | Wt 132.1 lb

## 2014-12-04 DIAGNOSIS — Z3009 Encounter for other general counseling and advice on contraception: Secondary | ICD-10-CM | POA: Diagnosis not present

## 2014-12-04 NOTE — Patient Instructions (Addendum)
  NEXPLANON

## 2014-12-04 NOTE — Progress Notes (Signed)
   CLINIC ENCOUNTER NOTE  History:  24 y.o. [id number] here today for contraception counseling.  Arabic interpreter present for encounter.  No current contraception, Plans to have next child in 3-4 years. She denies any abnormal vaginal discharge, bleeding, pelvic pain or other concerns.   History reviewed. No pertinent past medical history.  Past Surgical History  Procedure Laterality Date  . Cesarean section      The following portions of the patient's history were reviewed and updated as appropriate: allergies, current medications, past family history, past medical history, past social history, past surgical history and problem list.   Health Maintenance:  Normal pap on 10/31/14.  Review of Systems:  Pertinent items noted in HPI and remainder of comprehensive ROS otherwise negative.  Objective:  Physical Exam BP 113/67 mmHg  Pulse 95  Temp(Src) 98.6 F (37 C)  Wt 132 lb 1.6 oz (59.92 kg)  LMP 11/06/2014 CONSTITUTIONAL: Well-developed, well-nourished female in no acute distress.  HENT:  Normocephalic, atraumatic. External right and left ear normal. Oropharynx is clear and moist EYES: Conjunctivae and EOM are normal. Pupils are equal, round, and reactive to light. No scleral icterus.  NECK: Normal range of motion, supple, no masses SKIN: Skin is warm and dry. No rash noted. Not diaphoretic. No erythema. No pallor. NEUROLGIC: Alert and oriented to person, place, and time. Normal reflexes, muscle tone coordination. No cranial nerve deficit noted. PSYCHIATRIC: Normal mood and affect. Normal behavior. Normal judgment and thought content. CARDIOVASCULAR: Normal heart rate noted RESPIRATORY: Effort and breath sounds normal, no problems with respiration noted ABDOMEN: Soft, no distention noted.   PELVIC: Deferred MUSCULOSKELETAL: Normal range of motion. No edema noted.   Assessment & Plan:  Encounter for other general counseling or advice on contraception Reviewed all forms of birth  control options available including abstinence; over the counter/barrier methods; hormonal contraceptive medication including pill, patch, ring, injection,contraceptive implant; hormonal and nonhormonal IUDs; permanent sterilization options including vasectomy and the various tubal sterilization modalities. Risks and benefits reviewed with emphasis on LARCs.  Questions were answered.  Information was given to patient to review. Patient wants Nexplanon, will return in 2 weeks for placement.  Advised no unprotected intercourse until placement.   Return in about 2 weeks (around 12/18/2014) for Nexplanon insertion (needs interpreter).   Jaynie CollinsUGONNA  ANYANWU, MD, FACOG Attending Obstetrician & Gynecologist, [facility]

## 2014-12-19 ENCOUNTER — Ambulatory Visit: Payer: Medicaid Other | Admitting: Family Medicine

## 2014-12-19 ENCOUNTER — Ambulatory Visit (INDEPENDENT_AMBULATORY_CARE_PROVIDER_SITE_OTHER): Payer: Medicaid Other | Admitting: Family Medicine

## 2014-12-19 ENCOUNTER — Encounter: Payer: Self-pay | Admitting: Obstetrics and Gynecology

## 2014-12-19 ENCOUNTER — Encounter: Payer: Self-pay | Admitting: Family Medicine

## 2014-12-19 VITALS — BP 125/69 | HR 92 | Temp 98.9°F | Ht 61.0 in | Wt 135.5 lb

## 2014-12-19 DIAGNOSIS — Z3201 Encounter for pregnancy test, result positive: Secondary | ICD-10-CM | POA: Diagnosis not present

## 2014-12-19 LAB — POCT PREGNANCY, URINE: PREG TEST UR: POSITIVE — AB

## 2014-12-19 NOTE — Progress Notes (Signed)
Patient presents for nexplanon placement.  Has positive pregnancy test.  Pregnancy verification letter given.  F/u for initial OB care.  Levie HeritageJacob J Stinson, DO 12/19/2014 1:49 PM

## 2014-12-19 NOTE — Progress Notes (Signed)
[employer]#  [id number]

## 2015-02-16 NOTE — L&D Delivery Note (Signed)
Delivery Note At 4:10 AM a viable and healthy female was delivered via Vaginal, Spontaneous Delivery (Presentation: Left Occiput Anterior).  APGAR: 8, 9; weight  .   Placenta status: Intact, Spontaneous.  Cord: 3 vessels with the following complications: None.   Anesthesia: None  Episiotomy: None Lacerations: None Suture Repair: N/A Est. Blood Loss (mL): 50  Mom to postpartum.  Baby to Couplet care / Skin to Skin   Courtney Howard is a 25 y.o. female [phone] with IUP at 7932w6d admitted for SOL .  She progressed without augmentation to complete and pushed through one contraction to deliver.  Cord clamping delayed by several minutes then clamped by CNM and cut by CNM.  Placenta intact and spontaneous, bleeding minimal.  Intact perineum.  Mom and baby stable prior to transfer to postpartum. She plans on breastfeeding. She is undecided for birth control.  Marland Kitchen.  [name], Courtney Howard 09/02/2015, 4:29 AM

## 2015-03-24 ENCOUNTER — Other Ambulatory Visit (HOSPITAL_COMMUNITY)
Admission: RE | Admit: 2015-03-24 | Discharge: 2015-03-24 | Disposition: A | Discharge status: Home / Self Care | Payer: Medicaid Other | Source: Ambulatory Visit | Attending: Advanced Practice Midwife | Admitting: Advanced Practice Midwife

## 2015-03-24 ENCOUNTER — Ambulatory Visit (INDEPENDENT_AMBULATORY_CARE_PROVIDER_SITE_OTHER): Payer: Medicaid Other | Admitting: Advanced Practice Midwife

## 2015-03-24 ENCOUNTER — Encounter: Payer: Self-pay | Admitting: Advanced Practice Midwife

## 2015-03-24 ENCOUNTER — Encounter: Payer: Self-pay | Admitting: General Practice

## 2015-03-24 VITALS — BP 110/62 | HR 90 | Temp 98.2°F | Ht 63.0 in | Wt 134.0 lb

## 2015-03-24 DIAGNOSIS — Z98891 History of uterine scar from previous surgery: Secondary | ICD-10-CM | POA: Insufficient documentation

## 2015-03-24 DIAGNOSIS — Z113 Encounter for screening for infections with a predominantly sexual mode of transmission: Secondary | ICD-10-CM

## 2015-03-24 DIAGNOSIS — Z3482 Encounter for supervision of other normal pregnancy, second trimester: Secondary | ICD-10-CM

## 2015-03-24 DIAGNOSIS — O34219 Maternal care for unspecified type scar from previous cesarean delivery: Secondary | ICD-10-CM

## 2015-03-24 LAB — POCT URINALYSIS DIP (DEVICE)
Bilirubin Urine: NEGATIVE
Glucose, UA: NEGATIVE mg/dL
HGB URINE DIPSTICK: NEGATIVE
Ketones, ur: NEGATIVE mg/dL
LEUKOCYTES UA: NEGATIVE
Nitrite: NEGATIVE
Protein, ur: NEGATIVE mg/dL
SPECIFIC GRAVITY, URINE: 1.025 (ref 1.005–1.030)
UROBILINOGEN UA: 0.2 mg/dL (ref 0.0–1.0)
pH: 6 (ref 5.0–8.0)

## 2015-03-24 LAB — OB RESULTS CONSOLE GC/CHLAMYDIA: Gonorrhea: NEGATIVE

## 2015-03-24 MED ORDER — PRENATAL VITAMINS 0.8 MG PO TABS: 1.0000 | ORAL_TABLET | Freq: Every day | ORAL | Status: DC

## 2015-03-24 NOTE — Patient Instructions (Signed)

## 2015-03-24 NOTE — Progress Notes (Signed)
Feryal used for interpreter for check in

## 2015-03-24 NOTE — Progress Notes (Signed)
New OB   See Smartset  Subjective:    Courtney Howard is a [id number] [redacted]w[redacted]d being seen today for her first obstetrical visit.  Her obstetrical history is significant for Late to care, prior C/S followed by VBAC x 2. Patient does intend to breast feed. Pregnancy history fully reviewed.  Patient reports no complaints.  Filed Vitals:   03/24/15 1331 03/24/15 1332  BP: 110/62   Pulse: 90   Temp: 98.2 F (36.8 C)   Height:   (1.6 m)  Weight: 134 lb (60.782 kg)     HISTORY: OB History  Gravida Para Term Preterm AB SAB TAB Ectopic Multiple Living  0 0 0 0 0 0 3    # Outcome Date GA Lbr Len/2nd Weight Sex Delivery Anes PTL Lv  4 Current           3 Term 01/26/14 [redacted]w[redacted]d 06:59 / 00:04 7 lb (3.175 kg) F VBAC None N Y     Comments: Baby born in the caul  2 Term 08/28/11 [redacted]w[redacted]d  7 lb (3.175 kg) F VBAC   Y     Comments: No complications  1 Term 05/15/09 [redacted]w[redacted]d  8 lb (3.629 kg) M CS-Unspec   Y     Comments: No problems     Past Medical History  Diagnosis Date  . Medical history non-contributory    Past Surgical History  Procedure Laterality Date  . Cesarean section     History reviewed. No pertinent family history.   Exam    Uterus:  Fundal Height: 19 cm  Pelvic Exam:    Perineum: Normal Perineum   Vulva: Bartholin's, Urethra, Skene's normal   Vagina:  normal mucosa, normal discharge   pH:    Cervix: multiparous appearance   Adnexa: no mass, fullness, tenderness   Bony Pelvis: gynecoid  System: Breast:  normal appearance, no masses or tenderness   Skin: normal coloration and turgor, no rashes    Neurologic: oriented, grossly non-focal   Extremities: normal strength, tone, and muscle mass   HEENT neck supple with midline trachea   Mouth/Teeth mucous membranes moist, pharynx normal without lesions   Neck supple and no masses   Cardiovascular: regular rate and rhythm   Respiratory:  appears well, vitals normal, no respiratory distress, acyanotic, normal RR, ear and  throat exam is normal, neck free of mass or lymphadenopathy, chest clear, no wheezing, crepitations, rhonchi, normal symmetric air entry   Abdomen: soft, non-tender; bowel sounds normal; no masses,  no organomegaly   Urinary: urethral meatus normal      Assessment:    Pregnancy: [id number] Patient Active Problem List   Diagnosis Date Noted  . Previous cesarean delivery affecting pregnancy, antepartum 03/24/2015    Closely spaced pregnancy    Plan:     Initial labs drawn. Prenatal vitamins. Problem list reviewed and updated. Genetic Screening discussed Quad Screen: ordered.  Ultrasound discussed; fetal survey: ordered.  Follow up in 4 weeks. 50% of 30 min visit spent on counseling and coordination of care.   Interpretor present Routines reviewed.  Discussed prior c/s followed by 2 VBACs. Desires another TOLAC (consent signed)    Courtney Howard 03/24/2015

## 2015-03-25 LAB — PRENATAL PROFILE (SOLSTAS)
ANTIBODY SCREEN: NEGATIVE
Basophils Absolute: 0 10*3/uL (ref 0.0–0.1)
Basophils Relative: 0 % (ref 0–1)
EOS ABS: 0.1 10*3/uL (ref 0.0–0.7)
Eosinophils Relative: 1 % (ref 0–5)
HCT: 31.4 % — ABNORMAL LOW (ref 36.0–46.0)
HIV: NONREACTIVE
Hemoglobin: 10.7 g/dL — ABNORMAL LOW (ref 12.0–15.0)
Hepatitis B Surface Ag: NEGATIVE
LYMPHS ABS: 2.1 10*3/uL (ref 0.7–4.0)
Lymphocytes Relative: 25 % (ref 12–46)
MCH: 30.5 pg (ref 26.0–34.0)
MCHC: 34.1 g/dL (ref 30.0–36.0)
MCV: 89.5 fL (ref 78.0–100.0)
MONOS PCT: 8 % (ref 3–12)
MPV: 10.6 fL (ref 8.6–12.4)
Monocytes Absolute: 0.7 10*3/uL (ref 0.1–1.0)
NEUTROS PCT: 66 % (ref 43–77)
Neutro Abs: 5.5 10*3/uL (ref 1.7–7.7)
PLATELETS: 274 10*3/uL (ref 150–400)
RBC: 3.51 MIL/uL — ABNORMAL LOW (ref 3.87–5.11)
RDW: 13.6 % (ref 11.5–15.5)
RUBELLA: 14.6 {index} — AB (ref <0.90)
Rh Type: POSITIVE
WBC: 8.3 10*3/uL (ref 4.0–10.5)

## 2015-03-25 LAB — CULTURE, OB URINE: Colony Count: 8000

## 2015-03-25 LAB — GC/CHLAMYDIA PROBE AMP (~~LOC~~) NOT AT ARMC
CHLAMYDIA, DNA PROBE: NEGATIVE
NEISSERIA GONORRHEA: NEGATIVE

## 2015-03-26 LAB — PRESCRIPTION MONITORING PROFILE (19 PANEL)
AMPHETAMINE/METH: NEGATIVE ng/mL
BARBITURATE SCREEN, URINE: NEGATIVE ng/mL
Benzodiazepine Screen, Urine: NEGATIVE ng/mL
Buprenorphine, Urine: NEGATIVE ng/mL
CARISOPRODOL, URINE: NEGATIVE ng/mL
CREATININE, URINE: 110.48 mg/dL (ref >20.0)
Cannabinoid Scrn, Ur: NEGATIVE ng/mL
Cocaine Metabolites: NEGATIVE ng/mL
ECSTASY: NEGATIVE ng/mL
Fentanyl, Ur: NEGATIVE ng/mL
METHADONE SCREEN, URINE: NEGATIVE ng/mL
Meperidine, Ur: NEGATIVE ng/mL
Methaqualone: NEGATIVE ng/mL
NITRITES URINE, INITIAL: NEGATIVE ug/mL
OPIATE SCREEN, URINE: NEGATIVE ng/mL
Oxycodone Screen, Ur: NEGATIVE ng/mL
Phencyclidine, Ur: NEGATIVE ng/mL
Propoxyphene: NEGATIVE ng/mL
TAPENTADOLUR: NEGATIVE ng/mL
Tramadol Scrn, Ur: NEGATIVE ng/mL
Zolpidem, Urine: NEGATIVE ng/mL
pH, Initial: 6.7 pH (ref 4.5–8.9)

## 2015-03-26 LAB — HEMOGLOBINOPATHY EVALUATION
HGB A: 96.7 % — AB (ref 96.8–97.8)
Hemoglobin Other: 0 %
Hgb A2 Quant: 3.3 % — ABNORMAL HIGH (ref 2.2–3.2)
Hgb F Quant: 0 % (ref 0.0–2.0)
Hgb S Quant: 0 %

## 2015-04-01 ENCOUNTER — Encounter (HOSPITAL_COMMUNITY): Payer: Self-pay

## 2015-04-01 ENCOUNTER — Other Ambulatory Visit: Payer: Self-pay | Admitting: General Practice

## 2015-04-01 ENCOUNTER — Ambulatory Visit (HOSPITAL_COMMUNITY)
Admission: RE | Admit: 2015-04-01 | Discharge: 2015-04-01 | Disposition: A | Discharge status: Home / Self Care | Payer: Medicaid Other | Source: Ambulatory Visit | Attending: Advanced Practice Midwife | Admitting: Advanced Practice Midwife

## 2015-04-01 DIAGNOSIS — O34219 Maternal care for unspecified type scar from previous cesarean delivery: Secondary | ICD-10-CM | POA: Diagnosis not present

## 2015-04-01 DIAGNOSIS — Z36 Encounter for antenatal screening of mother: Secondary | ICD-10-CM | POA: Diagnosis present

## 2015-04-01 DIAGNOSIS — O0932 Supervision of pregnancy with insufficient antenatal care, second trimester: Secondary | ICD-10-CM | POA: Insufficient documentation

## 2015-04-01 DIAGNOSIS — Z3A18 18 weeks gestation of pregnancy: Secondary | ICD-10-CM

## 2015-04-01 DIAGNOSIS — Z3689 Encounter for other specified antenatal screening: Secondary | ICD-10-CM

## 2015-04-21 ENCOUNTER — Ambulatory Visit (INDEPENDENT_AMBULATORY_CARE_PROVIDER_SITE_OTHER): Payer: Medicaid Other | Admitting: Obstetrics and Gynecology

## 2015-04-21 ENCOUNTER — Encounter: Payer: Self-pay | Admitting: Obstetrics and Gynecology

## 2015-04-21 ENCOUNTER — Telehealth: Payer: Self-pay | Admitting: Obstetrics and Gynecology

## 2015-04-21 VITALS — BP 105/64 | HR 89 | Wt 139.5 lb

## 2015-04-21 DIAGNOSIS — O0932 Supervision of pregnancy with insufficient antenatal care, second trimester: Secondary | ICD-10-CM

## 2015-04-21 DIAGNOSIS — O0930 Supervision of pregnancy with insufficient antenatal care, unspecified trimester: Secondary | ICD-10-CM

## 2015-04-21 DIAGNOSIS — O34219 Maternal care for unspecified type scar from previous cesarean delivery: Secondary | ICD-10-CM

## 2015-04-21 LAB — POCT URINALYSIS DIP (DEVICE)
Bilirubin Urine: NEGATIVE
GLUCOSE, UA: NEGATIVE mg/dL
HGB URINE DIPSTICK: NEGATIVE
KETONES UR: NEGATIVE mg/dL
Leukocytes, UA: NEGATIVE
NITRITE: NEGATIVE
PH: 6.5 (ref 5.0–8.0)
PROTEIN: NEGATIVE mg/dL
SPECIFIC GRAVITY, URINE: 1.02 (ref 1.005–1.030)
UROBILINOGEN UA: 0.2 mg/dL (ref 0.0–1.0)

## 2015-04-21 MED ORDER — PROMETHAZINE HCL 12.5 MG PO TABS: 12.5000 mg | ORAL_TABLET | Freq: Four times a day (QID) | ORAL | Status: DC | PRN

## 2015-04-21 MED ORDER — PRENATAL PLUS 27-1 MG PO TABS: 1.0000 | ORAL_TABLET | Freq: Every day | ORAL | Status: DC

## 2015-04-21 NOTE — Patient Instructions (Signed)

## 2015-04-21 NOTE — Telephone Encounter (Signed)
Patient requested records in person. [facility], due to being closer to home and more convenient. Release of records on file in the event [facility] needs additional records.

## 2015-04-21 NOTE — Progress Notes (Signed)
Subjective:  Courtney Howard is a 25 y.o. [phone] at 2259w5d being seen today for ongoing prenatal care.  She is currently monitored for the following issues for this low-risk pregnancy and has Previous cesarean delivery affecting pregnancy, antepartum on her problem list. Interpreter here Patient reports nausea without vomiting. Contractions: Not present. Vag. Bleeding: None.  Movement: Present. Denies leaking of fluid.   The following portions of the patient's history were reviewed and updated as appropriate: allergies, current medications, past family history, past medical history, past social history, past surgical history and problem list. Problem list updated.  Objective:   Filed Vitals:   04/21/15 1551  BP: 105/64  Pulse: 89  Weight: 139 lb 8 oz (63.277 kg)    Fetal Status: Fetal Heart Rate (bpm): 150   Movement: Present     General:  Alert, oriented and cooperative. Patient is in no acute distress.  Skin: Skin is warm and dry. No rash noted.   Cardiovascular: Normal heart rate noted  Respiratory: Normal respiratory effort, no problems with respiration noted  Abdomen: Soft, gravid, appropriate for gestational age. Pain/Pressure: Absent     Pelvic: Vag. Bleeding: None     Cervical exam deferred        Extremities: Normal range of motion.     Mental Status: Normal mood and affect. Normal behavior. Normal judgment and thought content.   Urinalysis: Urine Protein: Negative Urine Glucose: Negative Anatomic US nl except small CPC (no F/U recommended)  Assessment and Plan:  Pregnancy: G4P3003 at 5259w5d  1. Previous cesarean delivery affecting pregnancy, antepartum   2. Late prenatal care affecting pregnancy Rx PNVs Nausea > rx Phenergan Preterm labor symptoms and general obstetric precautions including but not limited to vaginal bleeding, contractions, leaking of fluid and fetal movement were reviewed in detail with the patient. Please refer to After Visit Summary for other  counseling recommendations.  Return in about 1 month (around 05/22/2015).   Danae Orleanseirdre C Katalyna Socarras, CNM

## 2015-07-25 ENCOUNTER — Encounter: Payer: Self-pay | Admitting: Obstetrics and Gynecology

## 2015-07-25 DIAGNOSIS — O34219 Maternal care for unspecified type scar from previous cesarean delivery: Secondary | ICD-10-CM

## 2015-07-25 NOTE — Progress Notes (Signed)
[facility] today, with the use of an Arabic interpreter discussed with Ms. Scholten re: mode of delivery.    She is currently a 25 y/o G4P3003 @ 35/2 (EDC 7/12, dated by 18wk u/s) with uncomplicated pregnancy. R/B/A d/w her, including 1-1.5% risk of uterine rupture, which is slightly increased if use of labor medications are needed, and maternal/fetal morbidity and mortality if uterine rupture occurs. See [facility] note for full details. Ms. Catha Gosselinsmail is wanting to try for another VBAC, even if it means IOL at post dates. VBAC form signed. I believe she will be another excellent candidate  Cornelia Copaharlie Nemesis Rainwater, Jr MD Attending Center for [facility](Faculty Practice)

## 2015-08-29 ENCOUNTER — Telehealth (HOSPITAL_COMMUNITY): Payer: Self-pay | Admitting: *Deleted

## 2015-08-29 ENCOUNTER — Encounter (HOSPITAL_COMMUNITY): Payer: Self-pay | Admitting: *Deleted

## 2015-08-29 NOTE — Telephone Encounter (Signed)
Preadmission screen Interpreter number [phone]

## 2015-09-02 ENCOUNTER — Inpatient Hospital Stay (HOSPITAL_COMMUNITY)
Admission: AD | Admit: 2015-09-02 | Discharge: 2015-09-03 | DRG: 775 | Disposition: A | Discharge status: Home / Self Care | Payer: Medicaid Other | Source: Ambulatory Visit | Attending: Obstetrics and Gynecology | Admitting: Obstetrics and Gynecology

## 2015-09-02 ENCOUNTER — Encounter (HOSPITAL_COMMUNITY): Payer: Self-pay | Admitting: *Deleted

## 2015-09-02 DIAGNOSIS — Z3483 Encounter for supervision of other normal pregnancy, third trimester: Secondary | ICD-10-CM | POA: Diagnosis present

## 2015-09-02 DIAGNOSIS — Z98891 History of uterine scar from previous surgery: Secondary | ICD-10-CM

## 2015-09-02 DIAGNOSIS — O34211 Maternal care for low transverse scar from previous cesarean delivery: Secondary | ICD-10-CM | POA: Diagnosis present

## 2015-09-02 DIAGNOSIS — Z3A40 40 weeks gestation of pregnancy: Secondary | ICD-10-CM

## 2015-09-02 DIAGNOSIS — O34219 Maternal care for unspecified type scar from previous cesarean delivery: Secondary | ICD-10-CM

## 2015-09-02 DIAGNOSIS — IMO0001 Reserved for inherently not codable concepts without codable children: Secondary | ICD-10-CM

## 2015-09-02 LAB — CBC
HEMATOCRIT: 35.2 % — AB (ref 36.0–46.0)
Hemoglobin: 12.2 g/dL (ref 12.0–15.0)
MCH: 30.6 pg (ref 26.0–34.0)
MCHC: 34.7 g/dL (ref 30.0–36.0)
MCV: 88.2 fL (ref 78.0–100.0)
Platelets: 248 10*3/uL (ref 150–400)
RBC: 3.99 MIL/uL (ref 3.87–5.11)
RDW: 13.1 % (ref 11.5–15.5)
WBC: 11.2 10*3/uL — AB (ref 4.0–10.5)

## 2015-09-02 LAB — TYPE AND SCREEN
ABO/RH(D): AB POS
Antibody Screen: NEGATIVE

## 2015-09-02 LAB — RPR: RPR: NONREACTIVE

## 2015-09-02 MED ORDER — ONDANSETRON HCL 4 MG PO TABS: 4.0000 mg | ORAL_TABLET | ORAL | Status: DC | PRN

## 2015-09-02 MED ORDER — WITCH HAZEL-GLYCERIN EX PADS: 1.0000 "application " | MEDICATED_PAD | CUTANEOUS | Status: DC | PRN

## 2015-09-02 MED ORDER — LACTATED RINGERS IV SOLN
500.0000 mL | INTRAVENOUS | Status: DC | PRN
Start: 2015-09-02 — End: 2015-09-03

## 2015-09-02 MED ORDER — PRENATAL MULTIVITAMIN CH
1.0000 | ORAL_TABLET | Freq: Every day | ORAL | Status: DC
  Administered 2015-09-02 – 2015-09-03 (×2): 1 via ORAL
  Filled 2015-09-02 (×2): qty 1

## 2015-09-02 MED ORDER — SOD CITRATE-CITRIC ACID 500-334 MG/5ML PO SOLN: 30.0000 mL | ORAL | Status: DC | PRN

## 2015-09-02 MED ORDER — OXYCODONE-ACETAMINOPHEN 5-325 MG PO TABS: 1.0000 | ORAL_TABLET | ORAL | Status: DC | PRN

## 2015-09-02 MED ORDER — COCONUT OIL OIL
1.0000 "application " | TOPICAL_OIL | Status: DC | PRN
  Filled 2015-09-02: qty 120

## 2015-09-02 MED ORDER — ONDANSETRON HCL 4 MG/2ML IJ SOLN: 4.0000 mg | INTRAMUSCULAR | Status: DC | PRN

## 2015-09-02 MED ORDER — DIBUCAINE 1 % RE OINT: 1.0000 "application " | TOPICAL_OINTMENT | RECTAL | Status: DC | PRN

## 2015-09-02 MED ORDER — LIDOCAINE HCL (PF) 1 % IJ SOLN: 30.0000 mL | INTRAMUSCULAR | Status: DC | PRN

## 2015-09-02 MED ORDER — SENNOSIDES-DOCUSATE SODIUM 8.6-50 MG PO TABS
2.0000 | ORAL_TABLET | ORAL | Status: DC
  Administered 2015-09-02: 2 via ORAL
  Filled 2015-09-02: qty 2

## 2015-09-02 MED ORDER — ZOLPIDEM TARTRATE 5 MG PO TABS: 5.0000 mg | ORAL_TABLET | Freq: Every evening | ORAL | Status: DC | PRN

## 2015-09-02 MED ORDER — SIMETHICONE 80 MG PO CHEW: 80.0000 mg | CHEWABLE_TABLET | ORAL | Status: DC | PRN

## 2015-09-02 MED ORDER — TETANUS-DIPHTH-ACELL PERTUSSIS 5-2.5-18.5 LF-MCG/0.5 IM SUSP: 0.5000 mL | Freq: Once | INTRAMUSCULAR | Status: DC

## 2015-09-02 MED ORDER — OXYTOCIN BOLUS FROM INFUSION: 500.0000 mL | INTRAVENOUS | Status: DC

## 2015-09-02 MED ORDER — FLEET ENEMA 7-19 GM/118ML RE ENEM: 1.0000 | ENEMA | RECTAL | Status: DC | PRN

## 2015-09-02 MED ORDER — DIPHENHYDRAMINE HCL 25 MG PO CAPS: 25.0000 mg | ORAL_CAPSULE | Freq: Four times a day (QID) | ORAL | Status: DC | PRN

## 2015-09-02 MED ORDER — OXYTOCIN 40 UNITS IN LACTATED RINGERS INFUSION - SIMPLE MED
2.5000 [IU]/h | INTRAVENOUS | Status: DC
  Filled 2015-09-02: qty 1000

## 2015-09-02 MED ORDER — LIDOCAINE HCL (PF) 1 % IJ SOLN
30.0000 mL | INTRAMUSCULAR | Status: DC | PRN
  Filled 2015-09-02: qty 30

## 2015-09-02 MED ORDER — LACTATED RINGERS IV SOLN
INTRAVENOUS | Status: DC
  Administered 2015-09-02: 03:00:00 via INTRAVENOUS

## 2015-09-02 MED ORDER — ACETAMINOPHEN 325 MG PO TABS
650.0000 mg | ORAL_TABLET | ORAL | Status: DC | PRN
  Filled 2015-09-02 (×2): qty 2

## 2015-09-02 MED ORDER — OXYTOCIN 40 UNITS IN LACTATED RINGERS INFUSION - SIMPLE MED: 2.5000 [IU]/h | INTRAVENOUS | Status: DC

## 2015-09-02 MED ORDER — ACETAMINOPHEN 325 MG PO TABS: 650.0000 mg | ORAL_TABLET | ORAL | Status: DC | PRN

## 2015-09-02 MED ORDER — OXYCODONE-ACETAMINOPHEN 5-325 MG PO TABS
1.0000 | ORAL_TABLET | ORAL | Status: DC | PRN
  Administered 2015-09-02: 1 via ORAL
  Filled 2015-09-02: qty 1

## 2015-09-02 MED ORDER — ACETAMINOPHEN 325 MG PO TABS
650.0000 mg | ORAL_TABLET | ORAL | Status: DC | PRN
Start: 2015-09-02 — End: 2015-09-03
  Administered 2015-09-02 (×2): 650 mg via ORAL

## 2015-09-02 MED ORDER — LACTATED RINGERS IV SOLN: INTRAVENOUS | Status: DC

## 2015-09-02 MED ORDER — OXYCODONE-ACETAMINOPHEN 5-325 MG PO TABS: 2.0000 | ORAL_TABLET | ORAL | Status: DC | PRN

## 2015-09-02 MED ORDER — BENZOCAINE-MENTHOL 20-0.5 % EX AERO
1.0000 "application " | INHALATION_SPRAY | CUTANEOUS | Status: DC | PRN
  Filled 2015-09-02: qty 56

## 2015-09-02 MED ORDER — IBUPROFEN 600 MG PO TABS
600.0000 mg | ORAL_TABLET | Freq: Four times a day (QID) | ORAL | Status: DC
  Administered 2015-09-02 – 2015-09-03 (×6): 600 mg via ORAL
  Filled 2015-09-02 (×6): qty 1

## 2015-09-02 MED ORDER — LACTATED RINGERS IV SOLN: 500.0000 mL | INTRAVENOUS | Status: DC | PRN

## 2015-09-02 MED ORDER — ONDANSETRON HCL 4 MG/2ML IJ SOLN: 4.0000 mg | Freq: Four times a day (QID) | INTRAMUSCULAR | Status: DC | PRN

## 2015-09-02 NOTE — Progress Notes (Signed)
[employer] interpretor used for admission teaching. Arabic [id number]

## 2015-09-02 NOTE — Lactation Note (Signed)
This note was copied from a baby's chart. Lactation Consultation Note  Patient Name: Courtney Howard GNFAO'ZToday's Date: 09/02/2015 Reason for consult: Initial assessment   Visited with Mom, baby 8 hrs old and has breast fed 3 times already.  This is Mom's 4th baby to breast feed, and denies needing any assistance, or have any questions.  Recommended she keep baby skin to skin as much as possible, and feed baby often on cue.  Talked about importance of a deep areolar latch.  Brochure left with Mom.  Let her know about IP and OP lactation services available to her.  Encouraged her to call as needed for assistance.  LC to follow up in am.    Consult Status Consult Status: Follow-up Date: 09/03/15 Follow-up type: In-patient    Judee ClaraSmith, Obdulio Mash E 09/02/2015, 12:56 PM

## 2015-09-02 NOTE — H&P (Signed)
Courtney Howard is a 25 y.o. female [phone] @[redacted]w[redacted]d  pt of [facility] presenting for SOL.  Contractions started at 10 pm last night and have gotten progressively stronger.  She reports good fetal movement, denies LOF, vaginal bleeding, vaginal itching/burning, urinary symptoms, h/a, dizziness, n/v, or fever/chills.    Pt has hx significant for C/S x 1, then VBAC x 2 without complication.  Maternal Medical History:    OB History    Gravida Para Term Preterm AB TAB SAB Ectopic Multiple Living   4 3 3  0 0 0 0 0 0 3     Past Medical History  Diagnosis Date  . Medical history non-contributory    Past Surgical History  Procedure Laterality Date  . Cesarean section     Family History: family history is not on file. Social History:  reports that she has never smoked. She has never used smokeless tobacco. She reports that she does not drink alcohol or use illicit drugs.   Prenatal Transfer Tool  Maternal Diabetes: No Genetic Screening: Normal Maternal Ultrasounds/Referrals: Normal Fetal Ultrasounds or other Referrals:  None Maternal Substance Abuse:  No Significant Maternal Medications:  None Significant Maternal Lab Results:  Lab values include: Group B Strep negative Other Comments:  None  Review of Systems  Constitutional: Negative for fever, chills and malaise/fatigue.  Eyes: Negative for blurred vision.  Respiratory: Negative for cough and shortness of breath.   Cardiovascular: Negative for chest pain.  Gastrointestinal: Positive for abdominal pain. Negative for heartburn and vomiting.  Genitourinary: Negative for dysuria, urgency and frequency.  Musculoskeletal: Negative.   Neurological: Negative for dizziness and headaches.  Psychiatric/Behavioral: Negative for depression.    Dilation: 5.5 Effacement (%): 90 Station: -2 Exam by:: K. WeissRN Blood pressure 130/76, pulse 84, temperature 98 F (36.7 C), temperature source Oral, resp. rate 18, height 5\' 3"  (1.6 m), weight 161 lb  (73.029 kg), last menstrual period 11/06/2014, currently breastfeeding. Maternal Exam:  Uterine Assessment: Contraction strength is moderate.  Contraction duration is 1 minute. Contraction frequency is regular.   Abdomen: Fetal presentation: vertex  Cervix: Cervix evaluated by digital exam.     Fetal Exam Fetal Monitor Review: Mode: ultrasound.   Baseline rate: 140.  Variability: moderate (6-25 bpm).   Pattern: no accelerations and no decelerations.    Fetal State Assessment: Category I - tracings are normal.     Physical Exam  Nursing note and vitals reviewed. Constitutional: She is oriented to person, place, and time. She appears well-developed and well-nourished.  Neck: Normal range of motion.  Cardiovascular: Normal rate.   Respiratory: Effort normal.  GI: Soft.  Musculoskeletal: Normal range of motion.  Neurological: She is alert and oriented to person, place, and time.  Skin: Skin is warm and dry.  Psychiatric: She has a normal mood and affect. Her behavior is normal. Judgment and thought content normal.    Prenatal labs: ABO, Rh: AB/POS/-- (02/06 1416) Antibody: NEG (02/06 1416) Rubella: 14.60 (02/06 1416) RPR: NON REAC (02/06 1416)  HBsAg: NEGATIVE (02/06 1416)  HIV: NONREACTIVE (02/06 1416)  GBS:   Negative  Assessment/Plan: 25 y.o. [id number] @[redacted]w[redacted]d   Active labor at term GBS negative  [facility] Anticipate NSVD   [name], Alleah Dearman 09/02/2015, 3:44 AM

## 2015-09-02 NOTE — Progress Notes (Signed)
UR chart review completed.  

## 2015-09-03 ENCOUNTER — Inpatient Hospital Stay (HOSPITAL_COMMUNITY): Payer: Medicaid Other

## 2015-09-03 MED ORDER — ACETAMINOPHEN 325 MG PO TABS: 650.0000 mg | ORAL_TABLET | ORAL | Status: DC | PRN

## 2015-09-03 MED ORDER — IBUPROFEN 600 MG PO TABS: 600.0000 mg | ORAL_TABLET | Freq: Four times a day (QID) | ORAL | Status: DC

## 2015-09-03 NOTE — Discharge Summary (Signed)
OB Discharge Summary     Patient Name: Courtney Howard DOB: 03-17-1990 MRN: [id number]  Date of admission: 09/02/2015 Delivering MD: Sharen Counter A   Date of discharge: 09/03/2015  Admitting diagnosis: 40.5 WEEKS CTX  Intrauterine pregnancy: [redacted]w[redacted]d     Secondary diagnosis:  Active Problems:   Active labor at term   VBAC, delivered, current hospitalization   Normal labor  Additional problems: none     Discharge diagnosis: Term Pregnancy Delivered                                                                                                Post partum procedures: none  Augmentation:  none  Complications: None  Hospital course:  Onset of Labor With Vaginal Delivery     25 y.o. yo [id number] at [redacted]w[redacted]d 23 was admitted in Active Labor on 09/02/2015. Patient had an uncomplicated labor course as follows:  Membrane Rupture Time/Date: 4:09 AM ,09/02/2015   Intrapartum Procedures: Episiotomy: None [1]                                         Lacerations:  None [1]  Patient had a delivery of a Viable infant. 09/02/2015  Information for the patient's newborn:  Courtney Howard, Courtney Howard [[id number]]  Delivery Method: Vaginal, Spontaneous Delivery (Filed from Delivery Summary)    Pateint had an uncomplicated postpartum course.  She is ambulating, tolerating a regular diet, passing flatus, and urinating well. Patient is discharged home in stable condition on 09/03/2015.    Physical exam  Filed Vitals:   09/02/15 1036 09/02/15 1836 09/02/15 1940 09/03/15 0625  BP: 103/43 116/50 122/64 106/60  Pulse: 83 74 79 69  Temp: 98.2 F (36.8 C) 98.8 F (37.1 C) 98.6 F (37 C) 98.9 F (37.2 C)  TempSrc: Other (Comment) Oral Oral Oral  Resp: Height:      Weight:       General: alert, cooperative and no distress Lochia: appropriate Uterine Fundus: firm Incision: N/A DVT Evaluation: No cords or calf tenderness. No significant calf/ankle edema. Labs: Lab Results  Component Value  Date   WBC 11.2* 09/02/2015   HGB 12.2 09/02/2015   HCT 35.2* 09/02/2015   MCV 88.2 09/02/2015   PLT 248 09/02/2015   CMP Latest Ref Rng 11/20/2014  Glucose 65 - 99 mg/dL 696(E)  BUN 6 - 20 mg/dL 8  Creatinine 9.52 - 8.41 mg/dL 3.24  Sodium 401 - 027 mmol/L 136  Potassium 3.5 - 5.1 mmol/L 4.0  Chloride 101 - 111 mmol/L 103  CO2 22 - 32 mmol/L 24  Calcium 8.9 - 10.3 mg/dL 2.5(D)  Total Protein 6.5 - 8.1 g/dL 7.4  Total Bilirubin 0.3 - 1.2 mg/dL 6.6(Y)  Alkaline Phos 38 - 126 U/L 59  AST 15 - 41 U/L 18  ALT 14 - 54 U/L 11(L)    Discharge instruction: per After Visit Summary and "Baby and Me Booklet".  After visit meds:    Medication List  TAKE these medications        acetaminophen 325 MG tablet  Commonly known as:  TYLENOL  Take 2 tablets (650 mg total) by mouth every 4 (four) hours as needed (mild discomfortORtemperature>/= 100.4 F).     ibuprofen 600 MG tablet  Commonly known as:  ADVIL,MOTRIN  Take 1 tablet (600 mg total) by mouth every 6 (six) hours.        Diet: routine diet  Activity: Advance as tolerated. Pelvic rest for 6 weeks.   Outpatient follow up:6 weeks Follow up Appt:No future appointments. Follow up Visit:No Follow-up on file.  Postpartum contraception: Nexplanon  Newborn Data: Live born female  Birth Weight: 7 lb 15.2 oz (3606 g) APGAR: 8, 9  Baby Feeding: Breast Disposition: pending   09/03/2015 Silvano BilisNoah B Maeleigh Buschman, MD

## 2015-09-03 NOTE — Discharge Instructions (Signed)

## 2015-09-03 NOTE — Progress Notes (Cosign Needed)
Post Partum Day 1  Subjective:  Courtney Howard is a 25 y.o. [id number] 2366w6d s/p SVD with TOLAC #3. No acute events overnight.  Pt denies problems with ambulating, voiding or po intake.  She denies nausea or vomiting.  Pain is well controlled.  She has had flatus. She has not had bowel movement.  Lochia Moderate.  Considering  Nexplanon for birth control.  Method of Feeding: Breast   Objective: BP 106/60 mmHg  Pulse 69  Temp(Src) 98.9 F (37.2 C) (Oral)  Resp 18  Ht 5\' 3"  (1.6 m)  Wt 73.029 kg (161 lb)  BMI 28.53 kg/m2  LMP 11/06/2014  Breastfeeding? Unknown  Physical Exam:  General: alert, cooperative and no distress. Lochia:normal flow Chest: CTAB Heart: RRR no m/r/g Abdomen: +BS, soft, nontender, fundus firm at/below umbilicus Uterine Fundus: firm DVT Evaluation: No evidence of DVT seen on physical exam. Extremities: no edema   Recent Labs  09/02/15 0305  HGB 12.2  HCT 35.2*    Assessment/Plan:  ASSESSMENT: Courtney Howard is a 25 y.o. G4P4004 3166w6d ppd #1 s/p NSVD. VBAC x 3. Is doing well.   Discharge tomorrow.   LOS: 1 day   Jaci Lazierrin Kerri-Anne Haeberle 09/03/2015, 7:25 AM la

## 2015-09-05 ENCOUNTER — Inpatient Hospital Stay (HOSPITAL_COMMUNITY): Admission: RE | Admit: 2015-09-05 | Payer: Medicaid Other | Source: Ambulatory Visit

## 2016-07-08 ENCOUNTER — Emergency Department (HOSPITAL_COMMUNITY)
Admission: EM | Admit: 2016-07-08 | Discharge: 2016-07-08 | Disposition: A | Discharge status: Home / Self Care | Payer: Medicaid Other | Attending: Emergency Medicine | Admitting: Emergency Medicine

## 2016-07-08 DIAGNOSIS — M2550 Pain in unspecified joint: Secondary | ICD-10-CM

## 2016-07-08 DIAGNOSIS — Z79899 Other long term (current) drug therapy: Secondary | ICD-10-CM | POA: Insufficient documentation

## 2016-07-08 DIAGNOSIS — K120 Recurrent oral aphthae: Secondary | ICD-10-CM | POA: Insufficient documentation

## 2016-07-08 DIAGNOSIS — T7840XA Allergy, unspecified, initial encounter: Secondary | ICD-10-CM

## 2016-07-08 LAB — CBC WITH DIFFERENTIAL/PLATELET
Basophils Absolute: 0 10*3/uL (ref 0.0–0.1)
Basophils Relative: 0 %
EOS PCT: 1 %
Eosinophils Absolute: 0.1 10*3/uL (ref 0.0–0.7)
HEMATOCRIT: 35.7 % — AB (ref 36.0–46.0)
Hemoglobin: 12.4 g/dL (ref 12.0–15.0)
LYMPHS ABS: 2.7 10*3/uL (ref 0.7–4.0)
LYMPHS PCT: 26 %
MCH: 30.6 pg (ref 26.0–34.0)
MCHC: 34.7 g/dL (ref 30.0–36.0)
MCV: 88.1 fL (ref 78.0–100.0)
Monocytes Absolute: 1.1 10*3/uL — ABNORMAL HIGH (ref 0.1–1.0)
Monocytes Relative: 10 %
NEUTROS ABS: 6.4 10*3/uL (ref 1.7–7.7)
Neutrophils Relative %: 63 %
PLATELETS: 309 10*3/uL (ref 150–400)
RBC: 4.05 MIL/uL (ref 3.87–5.11)
RDW: 12.5 % (ref 11.5–15.5)
WBC: 10.3 10*3/uL (ref 4.0–10.5)

## 2016-07-08 LAB — BASIC METABOLIC PANEL
ANION GAP: 9 (ref 5–15)
BUN: 7 mg/dL (ref 6–20)
CHLORIDE: 107 mmol/L (ref 101–111)
CO2: 24 mmol/L (ref 22–32)
Calcium: 9.1 mg/dL (ref 8.9–10.3)
Creatinine, Ser: 0.61 mg/dL (ref 0.44–1.00)
GFR calc Af Amer: >60 mL/min (ref >60)
GFR calc non Af Amer: >60 mL/min (ref >60)
GLUCOSE: 98 mg/dL (ref 65–99)
POTASSIUM: 3.9 mmol/L (ref 3.5–5.1)
SODIUM: 140 mmol/L (ref 135–145)

## 2016-07-08 MED ORDER — PREDNISONE 20 MG PO TABS: 40.0000 mg | ORAL_TABLET | Freq: Every day | ORAL | 0 refills | Status: DC

## 2016-07-08 MED ORDER — DIPHENHYDRAMINE HCL 25 MG PO CAPS: 25.0000 mg | ORAL_CAPSULE | Freq: Four times a day (QID) | ORAL | 0 refills | Status: DC | PRN

## 2016-07-08 MED ORDER — PREDNISONE 20 MG PO TABS
40.0000 mg | ORAL_TABLET | Freq: Once | ORAL | Status: DC
  Filled 2016-07-08: qty 2

## 2016-07-08 NOTE — ED Triage Notes (Signed)
Pt reports that two days ago she began to have itching all over her body.  Pt reports the itching on her body is better but pt is still having itching on her face.  Pt reports that she began to have joint pain yesterday along with pain in her chest and sore throat.  Pt denies any SOB.  Pt reports her joints feel swollen. Pt a/o x 4 and ambulatory.

## 2016-07-08 NOTE — ED Provider Notes (Signed)
[facility]-EMERGENCY DEPT Provider Note   CSN: [id number] Arrival date & time: 07/08/16  1501     History   Chief Complaint No chief complaint on file.   HPI Courtney Howard is a 26 y.o. female who presents with a rash and joint pain and swelling. She states that her symptoms began 2 days ago and gradually worsened. She has chest pain, elbow, wrist, knee, ankle pain. She reports associated swelling in the joints. She also reported a generalized rash on her torso and over her joints. It is itchy but has improved and is only on her right knee this time. No one at home has a similar rash. She doesn't know if she's been bitten by anything. She reports a subjective fever at night. She's never had anything like this before. No history of rheumatoid arthritis or autoimmune diseases. She also notes she has ulcerations in the mouth.  HPI  Past Medical History:  Diagnosis Date  . Medical history non-contributory     Patient Active Problem List   Diagnosis Date Noted  . Active labor at term 09/02/2015  . VBAC, delivered, current hospitalization 09/02/2015  . Normal labor 09/02/2015  . Previous cesarean delivery affecting pregnancy, antepartum 03/24/2015    Past Surgical History:  Procedure Laterality Date  . CESAREAN SECTION      OB History    Gravida Para Term Preterm AB Living   4 4 4  0 0 4   SAB TAB Ectopic Multiple Live Births   0 0 0 0 4       Home Medications    Prior to Admission medications   Medication Sig Start Date End Date Taking? Authorizing Provider  diphenhydrAMINE (BENADRYL) 25 mg capsule Take 1 capsule (25 mg total) by mouth every 6 (six) hours as needed. 07/08/16   Bethel Born, PA-C  predniSONE (DELTASONE) 20 MG tablet Take 2 tablets (40 mg total) by mouth daily. 07/08/16   Bethel Born, PA-C    Family History No family history on file.  Social History Social History  Substance Use Topics  . Smoking status: Never Smoker  . Smokeless tobacco:  Never Used  . Alcohol use No     Allergies   Patient has no known allergies.   Review of Systems Review of Systems  Constitutional: Positive for fever (subjective). Negative for chills.  HENT: Positive for mouth sores.   Respiratory: Negative for shortness of breath.   Cardiovascular: Positive for chest pain.  Gastrointestinal: Negative for abdominal pain, nausea and vomiting.  Musculoskeletal: Positive for arthralgias and joint swelling.  Skin: Positive for rash.  All other systems reviewed and are negative.    Physical Exam Updated Vital Signs BP 119/72 (BP Location: Left Arm)   Pulse 90   Temp 98.1 F (36.7 C) (Oral)   Resp 18   Ht 5\' 4"  (1.626 m)   Wt 58.7 kg (129 lb 5 oz)   SpO2 100%   BMI 22.20 kg/m   Physical Exam  Constitutional: She is oriented to person, place, and time. She appears well-developed and well-nourished. No distress.  Well-appearing  HENT:  Head: Normocephalic and atraumatic.  Mouth/Throat: Oral lesions present.  Several aphthous ulcers in the mouth and gumline  Eyes: Conjunctivae are normal. Pupils are equal, round, and reactive to light. Right eye exhibits no discharge. Left eye exhibits no discharge. No scleral icterus.  Neck: Normal range of motion.  Cardiovascular: Normal rate and regular rhythm.  Exam reveals no gallop and no friction  rub.   No murmur heard. Pulmonary/Chest: Effort normal and breath sounds normal. No respiratory distress. She has no wheezes. She has no rales. She exhibits no tenderness.  Abdominal: She exhibits no distension.  Musculoskeletal:  Tenderness of elbows, wrists, knees, and ankles with no obvious swelling. Ambulatory  Neurological: She is alert and oriented to person, place, and time.  Skin: Skin is warm and dry.  There is a rash over the right anterior knee and posterior knee.   Psychiatric: She has a normal mood and affect. Her behavior is normal.  Nursing note and vitals reviewed.    ED Treatments /  Results  Labs (all labs ordered are listed, but only abnormal results are displayed) Labs Reviewed - No data to display  EKG  EKG Interpretation None       Radiology No results found.  Procedures Procedures (including critical care time)  Medications Ordered in ED Medications - No data to display   Initial Impression / Assessment and Plan / ED Course  I have reviewed the triage vital signs and the nursing notes.  Pertinent labs & imaging results that were available during my care of the patient were reviewed by me and considered in my medical decision making (see chart for details).  26 year old female with rash, achy joints, and ulcerations. Unclear etiology. Sounds possibly autoimmune. Vitals are normal. Labs are normal. Will try a steroid burst. Strongly encouraged her to establish care with PCP. [facility] given. Return precautions given.  Final Clinical Impressions(s) / ED Diagnoses   Final diagnoses:  Allergic reaction, initial encounter  Arthralgia, unspecified joint    New Prescriptions New Prescriptions   DIPHENHYDRAMINE (BENADRYL) 25 MG CAPSULE    Take 1 capsule (25 mg total) by mouth every 6 (six) hours as needed.   PREDNISONE (DELTASONE) 20 MG TABLET    Take 2 tablets (40 mg total) by mouth daily.     Bethel BornGekas, Shantel Wesely Marie, PA-C 07/10/16 1739    Loren RacerYelverton, David, MD 07/16/16 [phone]

## 2016-07-08 NOTE — Discharge Instructions (Signed)
Take prednisone (steroid) for the next 5 days Take Benadryl for itching Follow up with [facility] Take Ibuprofen or Tylenol for pain

## 2016-07-09 NOTE — Congregational Nurse Program (Signed)
Congregational Nurse Program Note  Date of Encounter: 07/09/2016  Past Medical History: Past Medical History:  Diagnosis Date  . Medical history non-contributory     Encounter Details:     CNP Questionnaire - 07/09/16 1435      Patient Demographics   Is this a new or existing patient? New   Patient is considered a/an [place]   Race African     Patient Assistance   Location of Patient Assistance [place]   Patient's financial/insurance status Medicaid   Uninsured Patient ([place] [employer]s) No   Patient referred to apply for the following financial assistance Not Applicable   Transportation assistance No   Assistance securing medications No   Educational health offerings Medications;Nutrition;Exercise/physical activity     Encounter Details   Primary purpose of visit Post ED/Hospitalization Visit   Was an Emergency Department visit averted? Not Applicable   Patient referred to Not Applicable   Was a mental health screening completed? (GAINS tool) No   Does patient have dental issues? No   Does patient have vision issues? No   Does your patient have an abnormal blood pressure today? No   Since previous encounter, have you referred patient for abnormal blood pressure that resulted in a new diagnosis or medication change? No   Does your patient have an abnormal blood glucose today? No   Since previous encounter, have you referred patient for abnormal blood glucose that resulted in a new diagnosis or medication change? No   Was there a life-saving intervention made? No     This client did not come to the clinic. I visited her at the home for welfare check after it was reported to me that she visited ED yesterday. Client tells me that she is well now. She had allergic reaction to ? strawberries and is on Prednisone and Benadryl. Health education regarding these meds provided. Health education on signs and symptoms of allergic reaction also provided. Nicole CellaDorothy Kamaiya Antilla  RN BSN,PCCN,CNP [phone]

## 2016-07-22 ENCOUNTER — Ambulatory Visit (HOSPITAL_COMMUNITY)
Admission: EM | Admit: 2016-07-22 | Discharge: 2016-07-22 | Disposition: A | Discharge status: Home / Self Care | Payer: Medicaid Other | Attending: Internal Medicine | Admitting: Internal Medicine

## 2016-07-22 ENCOUNTER — Encounter (HOSPITAL_COMMUNITY): Payer: Self-pay | Admitting: Emergency Medicine

## 2016-07-22 DIAGNOSIS — M064 Inflammatory polyarthropathy: Secondary | ICD-10-CM | POA: Insufficient documentation

## 2016-07-22 DIAGNOSIS — Z3202 Encounter for pregnancy test, result negative: Secondary | ICD-10-CM

## 2016-07-22 DIAGNOSIS — L299 Pruritus, unspecified: Secondary | ICD-10-CM | POA: Insufficient documentation

## 2016-07-22 DIAGNOSIS — M1990 Unspecified osteoarthritis, unspecified site: Secondary | ICD-10-CM

## 2016-07-22 LAB — POCT PREGNANCY, URINE: PREG TEST UR: NEGATIVE

## 2016-07-22 LAB — POCT URINALYSIS DIP (DEVICE)
BILIRUBIN URINE: NEGATIVE
GLUCOSE, UA: NEGATIVE mg/dL
Hgb urine dipstick: NEGATIVE
Ketones, ur: NEGATIVE mg/dL
Leukocytes, UA: NEGATIVE
NITRITE: NEGATIVE
PH: 7.5 (ref 5.0–8.0)
PROTEIN: 30 mg/dL — AB
Specific Gravity, Urine: 1.015 (ref 1.005–1.030)
Urobilinogen, UA: 0.2 mg/dL (ref 0.0–1.0)

## 2016-07-22 LAB — URINALYSIS, ROUTINE W REFLEX MICROSCOPIC
Bilirubin Urine: NEGATIVE
Glucose, UA: NEGATIVE mg/dL
HGB URINE DIPSTICK: NEGATIVE
Ketones, ur: NEGATIVE mg/dL
Leukocytes, UA: NEGATIVE
NITRITE: NEGATIVE
Protein, ur: NEGATIVE mg/dL
Specific Gravity, Urine: 1.026 (ref 1.005–1.030)
pH: 7 (ref 5.0–8.0)

## 2016-07-22 LAB — SEDIMENTATION RATE: SED RATE: 120 mm/h — AB (ref 0–22)

## 2016-07-22 LAB — TSH: TSH: 1.3 u[IU]/mL (ref 0.350–4.500)

## 2016-07-22 LAB — URIC ACID: URIC ACID, SERUM: 3.5 mg/dL (ref 2.3–6.6)

## 2016-07-22 LAB — C-REACTIVE PROTEIN: CRP: 7.3 mg/dL — AB (ref <1.0)

## 2016-07-22 MED ORDER — NAPROXEN 500 MG PO TABS: 500.0000 mg | ORAL_TABLET | Freq: Two times a day (BID) | ORAL | 0 refills | Status: DC

## 2016-07-22 NOTE — Discharge Instructions (Addendum)
Tests for inflammatory causes of joint pain (rheumatoid, lupus, lyme disease) are pending.  The urgent care will contact you if these tests are abnormal and require followup.  Prescription for naproxen, an anti inflammatory/pain reliever medicine, was sent to the pharmacy.  It may be helpful to establish care with a primary care provider to help assess for treatable causes for your joint pain/swelling.  The [facility] is in this handout.  Recheck as needed.

## 2016-07-22 NOTE — ED Triage Notes (Signed)
Pt here for persistent itching and joint pain onset 2 weeks associated w/swelling   [facility] sx  A&O x4... NAD.Marland Kitchen.  Ambulatory

## 2016-07-22 NOTE — ED Provider Notes (Signed)
[facility]    CSN: [id number] Arrival date & time: 07/22/16  1439     History   Chief Complaint Chief Complaint  Patient presents with  . Pruritis    HPI Courtney Howard is a 26 y.o. female. She presents 2 weeks after presenting to the ER with itchy rash, and joint pains. Thee itching and rash have subsided, she is still having trouble with joint pains. These are symmetric, and she does have some joint swelling. Most prominent is wrist discomfort and warmth. Really hurts to pick up her baby.  Also having ankle, knee, shoulder discomfort. Nonspecific chest discomfort. Tactile temperature. No change in bowel habits, no diarrhea. CBC and CMP in the emergency room a couple weeks ago were unremarkable. Nursing a small child.   HPI  Past Medical History:  Diagnosis Date  . Medical history non-contributory     Patient Active Problem List   Diagnosis Date Noted  . Active labor at term 09/02/2015  . VBAC, delivered, current hospitalization 09/02/2015  . Normal labor 09/02/2015  . Previous cesarean delivery affecting pregnancy, antepartum 03/24/2015    Past Surgical History:  Procedure Laterality Date  . CESAREAN SECTION      OB History    Gravida Para Term Preterm AB Living   4 4 4  0 0 4   SAB TAB Ectopic Multiple Live Births   0 0 0 0 4       Home Medications    Prior to Admission medications   Medication Sig Start Date End Date Taking? Authorizing Provider  diphenhydrAMINE (BENADRYL) 25 mg capsule Take 1 capsule (25 mg total) by mouth every 6 (six) hours as needed. 07/08/16  Yes Bethel BornGekas, Kelly Marie, PA-C  diphenhydrAMINE (BENADRYL) 25 mg capsule Take 1 capsule (25 mg total) by mouth every 6 (six) hours as needed. 07/08/16  Yes Bethel BornGekas, Kelly Marie, PA-C  predniSONE (DELTASONE) 20 MG tablet Take 2 tablets (40 mg total) by mouth daily. 07/08/16  Yes Bethel BornGekas, Kelly Marie, PA-C  naproxen (NAPROSYN) 500 MG tablet Take 1 tablet (500 mg total) by mouth 2 (two) times  daily. 07/22/16   Eustace MooreMurray, Prescilla Monger W, MD    Family History History reviewed. No pertinent family history.  Social History Social History  Substance Use Topics  . Smoking status: Never Smoker  . Smokeless tobacco: Never Used  . Alcohol use No     Allergies   Patient has no known allergies.   Review of Systems Review of Systems  All other systems reviewed and are negative.    Physical Exam Triage Vital Signs ED Triage Vitals  Enc Vitals Group     BP 07/22/16 1509 104/65     Pulse Rate 07/22/16 1509 95     Resp 07/22/16 1509 16     Temp 07/22/16 1509 99.4 F (37.4 C)     Temp Source 07/22/16 1509 Oral     SpO2 07/22/16 1509 100 %     Weight --      Height --      Pain Score 07/22/16 1510 7     Pain Loc --    Updated Vital Signs BP 104/65 (BP Location: Right Arm)   Pulse 95   Temp 99.4 F (37.4 C) (Oral)   Resp 16   SpO2 100%   Breastfeeding? Yes   Physical Exam  Constitutional: She is oriented to person, place, and time. No distress.  HENT:  Head: Atraumatic.  Eyes:  Conjugate gaze observed, no eye  redness/discharge  Neck: Neck supple.  Cardiovascular: Normal rate and regular rhythm.   Pulmonary/Chest: No respiratory distress. She has no wheezes. She has no rales.  Lungs clear, symmetric breath sounds  Abdominal: She exhibits no distension.  Musculoskeletal: Normal range of motion.  Bilateral small wrist effusions with warmth and faint erythema, painful range of motion especially at the wrists but also with the hands. Bilateral knees are also slightly swollen and warm.  Neurological: She is alert and oriented to person, place, and time.  Skin: Skin is warm and dry.  Nursing note and vitals reviewed.    UC Treatments / Results  Labs (all labs ordered are listed, but only abnormal results are displayed) Labs Reviewed  SEDIMENTATION RATE - Abnormal; Notable for the following:       Result Value   Sed Rate 120 (*)    All other components within normal  limits  C-REACTIVE PROTEIN - Abnormal; Notable for the following:    CRP 7.3 (*)    All other components within normal limits  POCT URINALYSIS DIP (DEVICE) - Abnormal; Notable for the following:    Protein, ur 30 (*)    All other components within normal limits  URINE CULTURE  ANTINUCLEAR ANTIBODIES, IFA  TSH  URIC ACID  B. BURGDORFI ANTIBODIES  URINALYSIS, ROUTINE W REFLEX MICROSCOPIC  POCT PREGNANCY, URINE   Also on differential is rheumatoid, parvovirus B19.  Will pursue further evaluation if sed rate/CRP are elevated.  Procedures Procedures (including critical care time) None today   Final Clinical Impressions(s) / UC Diagnoses   Final diagnoses:  Inflammatory arthritis   Tests for inflammatory causes of joint pain (lupus, lyme disease) are pending.  The urgent care will contact you if these tests are abnormal and require followup.  Prescription for naproxen, an anti inflammatory/pain reliever medicine, was sent to the pharmacy.   It may be helpful to establish care with a primary care provider to help assess for treatable causes for your joint pain/swelling.  The office number for [facility] is in this handout.  Recheck as needed.    New Prescriptions Discharge Medication List as of 07/22/2016  4:43 PM    START taking these medications   Details  naproxen (NAPROSYN) 500 MG tablet Take 1 tablet (500 mg total) by mouth 2 (two) times daily., Starting Thu 07/22/2016, Normal         Eustace Moore, MD 07/24/16 [phone]

## 2016-07-23 LAB — URINE CULTURE: CULTURE: NO GROWTH

## 2016-07-23 LAB — ANTINUCLEAR ANTIBODIES, IFA: ANTINUCLEAR ANTIBODIES, IFA: NEGATIVE

## 2016-07-23 LAB — B. BURGDORFI ANTIBODIES

## 2016-08-30 ENCOUNTER — Ambulatory Visit (INDEPENDENT_AMBULATORY_CARE_PROVIDER_SITE_OTHER): Payer: Self-pay | Admitting: Physician Assistant

## 2016-08-30 ENCOUNTER — Encounter (INDEPENDENT_AMBULATORY_CARE_PROVIDER_SITE_OTHER): Payer: Self-pay | Admitting: Physician Assistant

## 2016-08-30 VITALS — BP 109/66 | HR 86 | Temp 99.2°F | Ht 63.78 in | Wt 131.2 lb

## 2016-08-30 DIAGNOSIS — R1012 Left upper quadrant pain: Secondary | ICD-10-CM

## 2016-08-30 DIAGNOSIS — N6489 Other specified disorders of breast: Secondary | ICD-10-CM

## 2016-08-30 DIAGNOSIS — R21 Rash and other nonspecific skin eruption: Secondary | ICD-10-CM

## 2016-08-30 DIAGNOSIS — M2550 Pain in unspecified joint: Secondary | ICD-10-CM

## 2016-08-30 MED ORDER — IBUPROFEN 800 MG PO TABS: 800.0000 mg | ORAL_TABLET | Freq: Three times a day (TID) | ORAL | 0 refills | Status: DC | PRN

## 2016-08-30 MED ORDER — HYDROXYZINE HCL 25 MG PO TABS
25.0000 mg | ORAL_TABLET | Freq: Two times a day (BID) | ORAL | 0 refills | Status: DC | PRN
Start: 2016-08-30 — End: 2017-07-14

## 2016-08-30 NOTE — Progress Notes (Signed)
Subjective:  Patient ID: Courtney Howard, female    DOB: 1991-02-03  Age: 26 y.o. MRN: [id number]  CC: rash and joint pains  HPI ERTHA NABOR is a 26 y.o. female from [place] presents as a new patient to address rash and joint pains. Has daily pain in the elbows, wrists, hands, knees, and feet. Rash is manifested as tiny papules all over the body. Rash worse at night. Benadryl does not help with itching. In regards to arthralgias, onset since May 2018. Was previously worked up on 07/22/16 which revealed elevated ESR and CRP.  TSH, uric acid, B burgdorferi Ab IgG+IgM, UA, uHCG, and ANA were normal/negative. Previous HIV (2015), RPR (03/2015), and hemoglobinopathy (03/2015) were normal. Prednisone did not help when prescribed on 07/08/16 by ED. Naproxen 500 mg helped relieve some of her pain.     Also complains of LUQ abdominal pain/discomfort since childhood. Reports having imaging done in 2015 and was told all was normal. Denies fatigue, fever, chills, nausea, vomiting, diarrhea, constipation, abdominal bloating, abdominal ecchymosis, CP, SOB, HA, or GI/GU sxs.    Lastly, patient has noticed left breast is larger than right breast since insertion of Nexplanon 7 months ago. No associated pain, redness, trauma, nipple discharge, or breast mass.    ROS Review of Systems  Constitutional: Negative for chills, fever and malaise/fatigue.  Eyes: Negative for blurred vision.  Respiratory: Negative for shortness of breath.   Cardiovascular: Negative for chest pain and palpitations.  Gastrointestinal: Positive for abdominal pain (LUQ since childhood). Negative for nausea.  Genitourinary: Negative for dysuria and hematuria.  Musculoskeletal: Positive for joint pain. Negative for myalgias.  Skin: Positive for rash.  Neurological: Negative for tingling and headaches.  Psychiatric/Behavioral: Negative for depression. The patient is not nervous/anxious.     Objective:  BP 109/66 (BP Location: Right Arm,  Patient Position: Sitting, Cuff Size: Normal)   Pulse 86   Temp 99.2 F (37.3 C) (Oral)   Ht 5' 3.78" (1.62 m)   Wt 131 lb 3.2 oz (59.5 kg)   SpO2 99%   Breastfeeding? Yes   BMI 22.68 kg/m   BP/Weight 08/30/2016 07/22/2016 2/56/3893  Systolic BP 734 287 681  Diastolic BP 66 65 70  Wt. (Lbs) 131.2 - 129.31  BMI 22.68 - 22.2      Physical Exam  Constitutional: She is oriented to person, place, and time.  Well developed, well nourished, NAD, polite  HENT:  Head: Normocephalic and atraumatic.  Eyes: Conjunctivae are normal. No scleral icterus.  Neck: Normal range of motion. Neck supple. No thyromegaly present.  Cardiovascular: Normal rate, regular rhythm and normal heart sounds.   Pulmonary/Chest: Effort normal and breath sounds normal.  Abdominal: Soft. Bowel sounds are normal. There is no tenderness.  Musculoskeletal: She exhibits no edema.  Neurological: She is alert and oriented to person, place, and time. No cranial nerve deficit. Coordination normal.  Skin: Skin is warm and dry. No rash noted. No erythema. No pallor.  Left breast nearly twice as large as the right breast; no erythema, tenderness, ecchymosis, skin texture changes, or nipple discharge.  Psychiatric: She has a normal mood and affect. Her behavior is normal. Thought content normal.  Vitals reviewed.    Assessment & Plan:   1. Rash and nonspecific skin eruption - Will consider advising patient to change Nexplanon to another birth control method if labs are negative. - Sedimentation Rate; Future - C-reactive protein; Future - Angiotensin converting enzyme; Future - HIV antibody; Future - C3 complement;  Future - Lipid Panel; Future - Begin hydrOXYzine (ATARAX/VISTARIL) 25 MG tablet; Take 1 tablet (25 mg total) by mouth 2 (two) times daily as needed.  Dispense: 40 tablet; Refill: 0  2. Arthralgia of multiple joints - Will consider advising patient to change Nexplanon to another birth control method if labs  are negative. - Sedimentation Rate; Future - C-reactive protein; Future - Angiotensin converting enzyme; Future - HIV antibody; Future - C3 complement; Future - Lipid Panel; Future - DG Hand Complete Right; Future - Begin ibuprofen (ADVIL,MOTRIN) 800 MG tablet; Take 1 tablet (800 mg total) by mouth every 8 (eight) hours as needed.  Dispense: 60 tablet; Refill: 0  3. LUQ pain - US abdomen complete - Possible splenic etiology of pain?   4. Breast asymmetry in female - MM Digital Diagnostic Unilat L; Future - MM DIGITAL SCREENING BILATERAL; Future   Meds ordered this encounter  Medications  . ibuprofen (ADVIL,MOTRIN) 800 MG tablet    Sig: Take 1 tablet (800 mg total) by mouth every 8 (eight) hours as needed.    Dispense:  60 tablet    Refill:  0    Order Specific Question:   Supervising Provider    Answer:   Tresa Garter [id number]  . hydrOXYzine (ATARAX/VISTARIL) 25 MG tablet    Sig: Take 1 tablet (25 mg total) by mouth 2 (two) times daily as needed.    Dispense:  40 tablet    Refill:  0    Order Specific Question:   Supervising Provider    Answer:   Tresa Garter [id number]    Follow-up: Return in about 2 weeks (around 09/13/2016) for arthralgias.   Clent Demark PA

## 2016-08-30 NOTE — Patient Instructions (Signed)
Joint Pain Joint pain, which is also called arthralgia, can be caused by many things. Joint pain often goes away when you follow your health care provider's instructions for relieving pain at home. However, joint pain can also be caused by conditions that require further treatment. Common causes of joint pain include:  Bruising in the area of the joint.  Overuse of the joint.  Wear and tear on the joints that occur with aging (osteoarthritis).  Various other forms of arthritis.  A buildup of a crystal form of uric acid in the joint (gout).  Infections of the joint (septic arthritis) or of the bone (osteomyelitis).  Your health care provider may recommend medicine to help with the pain. If your joint pain continues, additional tests may be needed to diagnose your condition. Follow these instructions at home: Watch your condition for any changes. Follow these instructions as directed to lessen the pain that you are feeling.  Take medicines only as directed by your health care provider.  Rest the affected area for as long as your health care provider says that you should. If directed to do so, raise the painful joint above the level of your heart while you are sitting or lying down.  Do not do things that cause or worsen pain.  If directed, apply ice to the painful area: ? Put ice in a plastic bag. ? Place a towel between your skin and the bag. ? Leave the ice on for 20 minutes, 2-3 times per day.  Wear an elastic bandage, splint, or sling as directed by your health care provider. Loosen the elastic bandage or splint if your fingers or toes become numb and tingle, or if they turn cold and blue.  Begin exercising or stretching the affected area as directed by your health care provider. Ask your health care provider what types of exercise are safe for you.  Keep all follow-up visits as directed by your health care provider. This is important.  Contact a health care provider if:  Your  pain increases, and medicine does not help.  Your joint pain does not improve within 3 days.  You have increased bruising or swelling.  You have a fever.  You lose 10 lb (4.5 kg) or more without trying. Get help right away if:  You are not able to move the joint.  Your fingers or toes become numb or they turn cold and blue. This information is not intended to replace advice given to you by your health care provider. Make sure you discuss any questions you have with your health care provider. Document Released: 02/01/2005 Document Revised: 07/04/2015 Document Reviewed: 11/13/2013 Elsevier Interactive Patient Education  2018 Elsevier Inc.  

## 2016-09-15 ENCOUNTER — Ambulatory Visit (INDEPENDENT_AMBULATORY_CARE_PROVIDER_SITE_OTHER): Payer: Medicaid Other | Admitting: Physician Assistant

## 2016-09-20 ENCOUNTER — Ambulatory Visit (INDEPENDENT_AMBULATORY_CARE_PROVIDER_SITE_OTHER): Payer: Self-pay | Admitting: Physician Assistant

## 2016-09-20 ENCOUNTER — Encounter (INDEPENDENT_AMBULATORY_CARE_PROVIDER_SITE_OTHER): Payer: Self-pay | Admitting: Physician Assistant

## 2016-09-20 VITALS — BP 102/63 | HR 73 | Temp 97.6°F | Wt 130.6 lb

## 2016-09-20 DIAGNOSIS — R21 Rash and other nonspecific skin eruption: Secondary | ICD-10-CM

## 2016-09-20 DIAGNOSIS — R1013 Epigastric pain: Secondary | ICD-10-CM

## 2016-09-20 DIAGNOSIS — M2550 Pain in unspecified joint: Secondary | ICD-10-CM

## 2016-09-20 DIAGNOSIS — N6489 Other specified disorders of breast: Secondary | ICD-10-CM

## 2016-09-20 DIAGNOSIS — R1012 Left upper quadrant pain: Secondary | ICD-10-CM

## 2016-09-20 MED ORDER — OMEPRAZOLE 20 MG PO CPDR: 20.0000 mg | DELAYED_RELEASE_CAPSULE | Freq: Every day | ORAL | 3 refills | Status: DC

## 2016-09-20 MED ORDER — IBUPROFEN 800 MG PO TABS: 800.0000 mg | ORAL_TABLET | Freq: Three times a day (TID) | ORAL | 0 refills | Status: DC | PRN

## 2016-09-20 NOTE — Progress Notes (Signed)
Subjective:  Patient ID: Courtney Howard, female    DOB: 02/28/1990  Age: 26 y.o. MRN: [id number]  CC: arthralgia  HPI Courtney Howard is a 26 y.o. female from [place] presents on f/u of rash, LUQ abdominal pain, arthralgias, and breast asymmetry.  She had imaging, labs, and medications ordered at the previous visit but she has been unable to do/pick up any of her orders. She was tending to her sick child. Also, pharmacy told her husband they do not have regular medicaid. Patient is confused because she thought her family had medical coverage. Patient states her rash and LUQ pain are resolved. However, she has developed epigastric and substernal pain that waxes and wanes. Not attributed to anything in particular. Does not endorse current abdominal pain, chest pain, SOB, HA, weakness, paresthesias, f/c/n/v, melena, BRBPR, or urinary symptoms.     Outpatient Medications Prior to Visit  Medication Sig Dispense Refill  . diphenhydrAMINE (BENADRYL) 25 mg capsule Take 1 capsule (25 mg total) by mouth every 6 (six) hours as needed. 30 capsule 0  . diphenhydrAMINE (BENADRYL) 25 mg capsule Take 1 capsule (25 mg total) by mouth every 6 (six) hours as needed. 30 capsule 0  . hydrOXYzine (ATARAX/VISTARIL) 25 MG tablet Take 1 tablet (25 mg total) by mouth 2 (two) times daily as needed. 40 tablet 0  . ibuprofen (ADVIL,MOTRIN) 800 MG tablet Take 1 tablet (800 mg total) by mouth every 8 (eight) hours as needed. 60 tablet 0  . naproxen (NAPROSYN) 500 MG tablet Take 1 tablet (500 mg total) by mouth 2 (two) times daily. 30 tablet 0  . predniSONE (DELTASONE) 20 MG tablet Take 2 tablets (40 mg total) by mouth daily. (Patient not taking: Reported on 08/30/2016) 10 tablet 0   No facility-administered medications prior to visit.      ROS Review of Systems  Constitutional: Negative for chills, fever and malaise/fatigue.  Eyes: Negative for blurred vision.  Respiratory: Negative for shortness of breath.    Cardiovascular: Negative for chest pain and palpitations.  Gastrointestinal: Positive for abdominal pain. Negative for nausea.  Genitourinary: Negative for dysuria and hematuria.  Musculoskeletal: Negative for joint pain and myalgias.  Skin: Negative for rash.  Neurological: Negative for tingling and headaches.  Psychiatric/Behavioral: Negative for depression. The patient is not nervous/anxious.     Objective:  BP 102/63 (BP Location: Right Arm, Patient Position: Sitting, Cuff Size: Normal)   Pulse 73   Temp 97.6 F (36.4 C) (Oral)   Wt 130 lb 9.6 oz (59.2 kg)   SpO2 100%   Breastfeeding? Yes   BMI 22.57 kg/m   BP/Weight 09/20/2016 08/30/2016 07/22/2016  Systolic BP 102 109 104  Diastolic BP 63 66 65  Wt. (Lbs) 130.6 131.2 -  BMI 22.57 22.68 -      Physical Exam  Constitutional: She is oriented to person, place, and time.  Well developed, well nourished, NAD, polite  HENT:  Head: Normocephalic and atraumatic.  Eyes: No scleral icterus.  Cardiovascular: Normal rate, regular rhythm and normal heart sounds.   Pulmonary/Chest: Effort normal and breath sounds normal.  Abdominal: Soft. Bowel sounds are normal. She exhibits no distension. There is no tenderness.  Musculoskeletal: She exhibits no edema.  Neurological: She is alert and oriented to person, place, and time. No cranial nerve deficit. Coordination normal.  Skin: Skin is warm and dry. No rash noted. No erythema. No pallor.  Psychiatric: She has a normal mood and affect. Her behavior is normal. Thought content  normal.  Vitals reviewed.    Assessment & Plan:   1. Arthralgia of multiple joints - Pt unable to get labs drawn yet. Did not pick up Ibuprofen at last visit because she was told she only has family planning medicaid. - ibuprofen (ADVIL,MOTRIN) 800 MG tablet; Take 1 tablet (800 mg total) by mouth every 8 (eight) hours as needed.  Dispense: 60 tablet; Refill: 0  2. Abdominal pain, epigastric - omeprazole  (PRILOSEC) 20 MG capsule; Take 1 capsule (20 mg total) by mouth daily.  Dispense: 30 capsule; Refill: 3 - H. pylori antibody, IgG - Lipase  3. LUQ pain - Self resolved - Still awaiting for her to have previously ordered lab work done  4. Breast asymmetry in female - Has not been to imaging yet  5. Rash and nonspecific skin eruption - Self resolved  * advised pt to go to DSS and apply for regular medicaid. If denied, then she could apply for orange card and [employer] discount.   Meds ordered this encounter  Medications  . ibuprofen (ADVIL,MOTRIN) 800 MG tablet    Sig: Take 1 tablet (800 mg total) by mouth every 8 (eight) hours as needed.    Dispense:  60 tablet    Refill:  0    Order Specific Question:   Supervising Provider    Answer:   Quentin Angst [id number]  . omeprazole (PRILOSEC) 20 MG capsule    Sig: Take 1 capsule (20 mg total) by mouth daily.    Dispense:  30 capsule    Refill:  3    Order Specific Question:   Supervising Provider    Answer:   Quentin Angst [id number]    Follow-up: Return in about 4 weeks (around 10/18/2016), or if symptoms worsen or fail to improve, for abdominal pain.   Loletta Specter PA

## 2016-09-20 NOTE — Patient Instructions (Signed)

## 2016-09-21 ENCOUNTER — Other Ambulatory Visit (INDEPENDENT_AMBULATORY_CARE_PROVIDER_SITE_OTHER): Payer: Self-pay | Admitting: Physician Assistant

## 2016-09-21 DIAGNOSIS — A048 Other specified bacterial intestinal infections: Secondary | ICD-10-CM

## 2016-09-21 LAB — LIPID PANEL
Chol/HDL Ratio: 2.6 ratio (ref 0.0–4.4)
Cholesterol, Total: 128 mg/dL (ref 100–199)
HDL: 50 mg/dL (ref >39)
LDL Calculated: 69 mg/dL (ref 0–99)
Triglycerides: 43 mg/dL (ref 0–149)
VLDL Cholesterol Cal: 9 mg/dL (ref 5–40)

## 2016-09-21 LAB — ANGIOTENSIN CONVERTING ENZYME: ANGIO CONVERT ENZYME: 35 U/L (ref 14–82)

## 2016-09-21 LAB — H. PYLORI ANTIBODY, IGG: H. pylori, IgG AbS: 4.45 Index Value — ABNORMAL HIGH (ref 0.00–0.79)

## 2016-09-21 LAB — HIV ANTIBODY (ROUTINE TESTING W REFLEX): HIV Screen 4th Generation wRfx: NONREACTIVE

## 2016-09-21 LAB — C3 COMPLEMENT: Complement C3, Serum: 146 mg/dL (ref 82–167)

## 2016-09-21 LAB — C-REACTIVE PROTEIN: CRP: 1.4 mg/L (ref 0.0–4.9)

## 2016-09-21 LAB — LIPASE: LIPASE: 36 U/L (ref 14–72)

## 2016-09-21 LAB — SEDIMENTATION RATE: SED RATE: 12 mm/h (ref 0–32)

## 2016-09-21 MED ORDER — AMOXICILLIN 500 MG PO TABS: 1000.0000 mg | ORAL_TABLET | Freq: Two times a day (BID) | ORAL | 0 refills | Status: AC

## 2016-09-21 MED ORDER — CLARITHROMYCIN 500 MG PO TABS: 500.0000 mg | ORAL_TABLET | Freq: Two times a day (BID) | ORAL | 0 refills | Status: AC

## 2016-09-24 ENCOUNTER — Ambulatory Visit (HOSPITAL_COMMUNITY): Payer: Medicaid Other

## 2016-10-01 MED FILL — ?OMEPRAZOLE DR 20 MG CAPSUL: 20 | 30 days supply | Qty: 30 | Fill #0

## 2016-10-01 MED FILL — IBUPROFEN 800 MG TABLET: 800 | 20 days supply | Qty: 60 | Fill #0

## 2017-04-25 ENCOUNTER — Encounter: Payer: Self-pay | Admitting: *Deleted

## 2017-07-14 ENCOUNTER — Ambulatory Visit (INDEPENDENT_AMBULATORY_CARE_PROVIDER_SITE_OTHER): Payer: Medicaid Other | Admitting: Obstetrics & Gynecology

## 2017-07-14 ENCOUNTER — Encounter: Payer: Self-pay | Admitting: Obstetrics & Gynecology

## 2017-07-14 VITALS — BP 114/66 | HR 76 | Wt 131.1 lb

## 2017-07-14 DIAGNOSIS — N898 Other specified noninflammatory disorders of vagina: Secondary | ICD-10-CM

## 2017-07-14 DIAGNOSIS — N939 Abnormal uterine and vaginal bleeding, unspecified: Secondary | ICD-10-CM

## 2017-07-14 MED ORDER — ESTRADIOL 2 MG PO TABS: 2.0000 mg | ORAL_TABLET | Freq: Every day | ORAL | 0 refills | Status: DC

## 2017-07-14 NOTE — Progress Notes (Signed)
History:  27 y.o. [id number] here today for complaints about her cycles. She has the Nexplanon. The Nexplanon was placed ?10/2015. Pt is breast feeding. She stopped breast feeding.   Pt denies pain with the bleeding. Pt reports that after she stopped 4/28-5/23. The bleeding stopped for 4 days and came back . There is only min bleeding at present for  bleeding at present.         The following portions of the patient's history were reviewed and updated as appropriate: allergies, current medications, past family history, past medical history, past social history, past surgical history and problem list.  Review of Systems:  Pertinent items are noted in HPI.    Objective:  Physical Exam Blood pressure 114/66, pulse 76, weight 131 lb 1.6 oz (59.5 kg), currently breastfeeding.  CONSTITUTIONAL: Well-developed, well-nourished female in no acute distress.  HENT:  Normocephalic, atraumatic EYES: Conjunctivae and EOM are normal. No scleral icterus.  NECK: Normal range of motion SKIN: Skin is warm and dry. No rash noted. Not diaphoretic.No pallor. NEUROLGIC: Alert and oriented to person, place, and time. Normal coordination.  Pelvic: defered   Assessment & Plan:  AUB on Nexplanon.   Estrace 2 mg po q day for 2 weeks  F/u if sx persist.   Total face-to-face time with patient was 15 min.  Greater than 50% was spent in counseling and coordination of care with the patient.   Norvella Loscalzo L. Harraway-Smith, M.D., [place]

## 2017-07-14 NOTE — Progress Notes (Signed)
Video Interpreter [phone]

## 2017-07-20 ENCOUNTER — Ambulatory Visit: Payer: Medicaid Other | Admitting: Obstetrics & Gynecology

## 2017-08-15 ENCOUNTER — Ambulatory Visit: Payer: Medicaid Other | Admitting: Obstetrics & Gynecology

## 2018-02-20 ENCOUNTER — Encounter: Payer: Self-pay | Admitting: Obstetrics and Gynecology

## 2018-02-20 ENCOUNTER — Ambulatory Visit (INDEPENDENT_AMBULATORY_CARE_PROVIDER_SITE_OTHER): Payer: Self-pay | Admitting: Obstetrics and Gynecology

## 2018-02-20 ENCOUNTER — Inpatient Hospital Stay (HOSPITAL_COMMUNITY)
Admission: AD | Admit: 2018-02-20 | Discharge: 2018-02-20 | Disposition: A | Discharge status: Home / Self Care | Payer: Medicaid Other | Attending: Family Medicine | Admitting: Family Medicine

## 2018-02-20 VITALS — BP 112/70 | HR 79 | Wt 135.0 lb

## 2018-02-20 DIAGNOSIS — Z975 Presence of (intrauterine) contraceptive device: Secondary | ICD-10-CM

## 2018-02-20 DIAGNOSIS — M79602 Pain in left arm: Secondary | ICD-10-CM

## 2018-02-20 DIAGNOSIS — T8389XA Other specified complication of genitourinary prosthetic devices, implants and grafts, initial encounter: Secondary | ICD-10-CM | POA: Insufficient documentation

## 2018-02-20 DIAGNOSIS — N939 Abnormal uterine and vaginal bleeding, unspecified: Secondary | ICD-10-CM

## 2018-02-20 DIAGNOSIS — T148XXA Other injury of unspecified body region, initial encounter: Secondary | ICD-10-CM

## 2018-02-20 MED ORDER — IBUPROFEN 600 MG PO TABS: 600.0000 mg | ORAL_TABLET | Freq: Four times a day (QID) | ORAL | 1 refills | Status: DC | PRN

## 2018-02-20 NOTE — MAU Note (Signed)
Pt not in lobby, went down the hall to the bathroom.

## 2018-02-20 NOTE — Progress Notes (Signed)
  Subjective:     Patient ID: Courtney Howard, female   DOB: 03/11/1990, 28 y.o.   MRN: [id number]  HPI   Ms.Courtney Howard is a 28 y.o. female [phone] here with questions regarding her nexplanon. She is experiencing abnormal bleeding and spotting in between her periods. She is also having left sided arm pain. She had her nexplanon placed Sept 2017. She started having pain in her arm 5 months ago. The pain feels like sharp pain in her left shoulder. She has not tried taking ibuprofen. LMP 3 days ago. The pain at times radiates around to her left upper shoulder, neck, and left upper back. She does not recall an injury, says she does not carry around heavy things. This is the first time she has been seen for this problem.   Review of Systems  Respiratory: Negative for chest tightness and shortness of breath.   Musculoskeletal: Positive for back pain, myalgias and neck pain. Negative for neck stiffness.  Neurological: Negative for headaches.   Objective:   Physical Exam Constitutional:      General: She is not in acute distress.    Appearance: Normal appearance. She is normal weight. She is not ill-appearing, toxic-appearing or diaphoretic.  HENT:     Head: Normocephalic.  Musculoskeletal: Normal range of motion.     Left shoulder: She exhibits tenderness. She exhibits normal range of motion.     Cervical back: She exhibits tenderness.     Comments: Nexplanon palpated in the left arm and in the proper position. No pain with palpation. Point tenderness in the left scapula shoulder region. Full ROM noted   Skin:    General: Skin is warm.  Neurological:     General: No focal deficit present.     Mental Status: She is alert.    Assessment:   1. Muscle strain  - Pain is unlikely 2/2 to nexplanon. Point tenderness noted in left scapula region.  -Recommend patient see a chiropractor this week - RICE therapy  -RX ibuprofen  - Recommend PCP   2. Abnormal vaginal bleeding  - None now -  Likely secondary to nexplanon; discussed normalcy.   Plan:     Duane Lope, NP 02/22/2018 1:13 PM

## 2018-02-20 NOTE — MAU Note (Signed)
Pt states she's been having pain in her arm and on her left side from the Nexplanon. [facility] and she wants it taken out.

## 2018-02-20 NOTE — Discharge Instructions (Signed)
Follow up at the [facility] Department or at the [facility] for removal of your Nexplanon and to discuss other contraceptive options.

## 2018-02-20 NOTE — MAU Provider Note (Signed)
First Provider Initiated Contact with Patient 02/20/18 1029       S: Ms. Courtney Howard J Pence is a 28 y.o. [phone]  who presents to MAU today complaining of left arm pain related to her Nexplanon. Reports she had Nexplanon placed in September 2018. Started having chronic, recurring left arm pain for about the last year. She would like her Nexplanon removed. Denies erythema, increased warmth and drainage from the site of placement. Able to palpate Nexplanon. Denies fevers and N/V.   O: BP 107/67 (BP Location: Right Arm)   Pulse 86   Temp 98.2 F (36.8 C)   Resp 16   Wt 60.8 kg   SpO2 100%   BMI 23.16 kg/m  GENERAL: Well-developed, well-nourished female in no acute distress.  Skin: Nexplanon palpated in left upper arm between biceps and triceps muscles. No pain to palpation. No overlying erythema, increased warmth or break in the skin.   A: 28 y.o. female with arm pain attributed to Nexplanon pain.   P: Discussed with patient that unable to remove Nexplanon in MAU as not an emergent procedure. Patient is stable on medical screen. [facility].   Arvilla MarketWallace, Catherine Lauren, DO 02/20/2018 11:18 AM

## 2018-02-20 NOTE — Patient Instructions (Addendum)
Muscle Strain A muscle strain is an injury that happens when a muscle is stretched longer than normal. This can happen during a fall, sports, or lifting. This can tear some muscle fibers. Usually, recovery from muscle strain takes 1-2 weeks. Complete healing normally takes 5-6 weeks. This condition is first treated with PRICE therapy. This involves:  Protecting your muscle from being injured again.  Resting your injured muscle.  Icing your injured muscle.  Applying pressure (compression) to your injured muscle. This may be done with a splint or elastic bandage.  Raising (elevating) your injured muscle. Your doctor may also recommend medicine for pain. Follow these instructions at home: If you have a splint:  Wear the splint as told by your doctor. Take it off only as told by your doctor.  Loosen the splint if your fingers or toes tingle, get numb, or turn cold and blue.  Keep the splint clean.  If the splint is not waterproof: ? Do not let it get wet. ? Cover it with a watertight covering when you take a bath or a shower. Managing pain, stiffness, and swelling   If directed, put ice on your injured area. ? If you have a removable splint, take it off as told by your doctor. ? Put ice in a plastic bag. ? Place a towel between your skin and the bag. ? Leave the ice on for 20 minutes, 2-3 times a day.  Move your fingers or toes often. This helps to avoid stiffness and lessen swelling.  Raise your injured area above the level of your heart while you are sitting or lying down.  Wear an elastic bandage as told by your doctor. Make sure it is not too tight. General instructions  Take over-the-counter and prescription medicines only as told by your doctor.  Limit your activity. Rest your injured muscle as told by your doctor. Your doctor may say that gentle movements are okay.  If physical therapy was prescribed, do exercises as told by your doctor.  Do not put pressure on any  part of the splint until it is fully hardened. This may take many hours.  Do not use any products that contain nicotine or tobacco, such as cigarettes and e-cigarettes. These can delay bone healing. If you need help quitting, ask your doctor.  Warm up before you exercise. This helps to prevent more muscle strains.  Ask your doctor when it is safe to drive if you have a splint.  Keep all follow-up visits as told by your doctor. This is important. Contact a doctor if:  You have more pain or swelling in your injured area. Get help right away if:  You have any of these problems in your injured area: ? You have numbness. ? You have tingling. ? You lose a lot of strength. Summary  A muscle strain is an injury that happens when a muscle is stretched longer than normal.  This condition is first treated with PRICE therapy. This includes protecting, resting, icing, adding pressure, and raising your injury.  Limit your activity. Rest your injured muscle as told by your doctor. Your doctor may say that gentle movements are okay.  Warm up before you exercise. This helps to prevent more muscle strains. This information is not intended to replace advice given to you by your health care provider. Make sure you discuss any questions you have with your health care provider. Document Released: 11/11/2007 Document Revised: 03/10/2016 Document Reviewed: 03/10/2016 Elsevier Interactive Patient Education  2019 Elsevier   Inc.    AREA PEDIATRIC/FAMILY PRACTICE PHYSICIANS  [facility] [address] [place], [place]  [address] Phone - [phone]   Fax - [phone]  [facility] [address] [place], [place] [address] Phone - [phone]   Fax - [phone]  [facility] [address] [place], [place]  [address] Phone - [phone]   Fax - [phone]  [facility] [address] [place], [place]  [address] Phone -  [phone]   Fax - [phone]  [facility] [address] [place], [place]  [address] Phone - [phone]   Fax - [phone]  [facility] [address] [place], [place]  [address] Phone - [phone]   Fax - [phone]  [facility] [place] [facility] [address] [place], [place]  [address] Phone - [phone]   Fax - [phone]  [facility] AT [facility] [address] [place], [place]  [address] Phone - [phone]   Fax - [phone]  [facility] AT [facility] [address] [place], [place]  [address] Phone - [phone]   Fax - [phone] [facility] AT [place] [address] [place], [place]  [address] Phone - [phone]   Fax - [phone]  [facility] [address] [place], [place]  [address] Phone - [phone]   Fax - [phone]  [facility] [address], [address] [place], [place]  [address] Phone - [phone]   Fax - [phone]  [facility] [place] [address] [place], [place]  [address] Phone - [phone]   Fax - [phone]  [facility] [address] [place], [place]  [address] Phone - [phone]  [facility] [address] [place], [place]  [address] Phone - [phone]   Fax - [phone]  [facility] [address] [place], [place]  [address] Phone - [phone]   Fax - [phone]  [facility] [address] [place], [place]  [address] Phone - [phone]   Fax - [phone]  [facility] [address] [place], [place]  [address] Phone - [phone]   Fax - [phone]   [facility] [address] [place], [place]  [address] Phone - [phone]   Fax - [phone]  [facility] [address] [place], [place]  [address] Phone - [phone]   Fax - [phone]  [facility] R[facility] [address] [place], [place]  [address] Phone - [phone]   Fax - [phone]  [facility] [address] [place], [place]  [address] Phone - [phone]   Fax - [phone]  [place] [facility] Alita Chyle [address] [place], [place]  [address] Phone - [phone]   Fax - [phone] Gerarda Fraction [address] [place], [place]  [address] Phone - [phone]   Fax - [phone]  [facility] [address] [place], [place]  [address] Phone - [phone]   Fax - [phone]  [facility] - [place] [address] [place], [place]  [address] Phone - [phone]   Fax - [phone]  [facility] [place] Wyvonne Lenz MD [address] [place] [address] Phone [phone]  Fax [phone]

## 2018-02-20 NOTE — Progress Notes (Signed)
Bedor Elnegomi arabic interpreter present with pt. Has had pain L side of neck, L arm and chest area for 5 months. Periods are irregular and sometimes long and heavy. Cannot sleep due to pain in L arm and neck. THinks nexplanon is causing the problems

## 2018-12-20 DIAGNOSIS — Z0389 Encounter for observation for other suspected diseases and conditions ruled out: Secondary | ICD-10-CM | POA: Diagnosis not present

## 2018-12-20 DIAGNOSIS — Z01419 Encounter for gynecological examination (general) (routine) without abnormal findings: Secondary | ICD-10-CM | POA: Diagnosis not present

## 2018-12-20 DIAGNOSIS — Z1388 Encounter for screening for disorder due to exposure to contaminants: Secondary | ICD-10-CM | POA: Diagnosis not present

## 2018-12-20 DIAGNOSIS — Z3046 Encounter for surveillance of implantable subdermal contraceptive: Secondary | ICD-10-CM | POA: Diagnosis not present

## 2018-12-20 DIAGNOSIS — Z3009 Encounter for other general counseling and advice on contraception: Secondary | ICD-10-CM | POA: Diagnosis not present

## 2019-03-12 ENCOUNTER — Encounter (HOSPITAL_COMMUNITY): Payer: Self-pay

## 2019-03-12 ENCOUNTER — Ambulatory Visit (HOSPITAL_COMMUNITY)
Admission: EM | Admit: 2019-03-12 | Discharge: 2019-03-12 | Disposition: A | Discharge status: Home / Self Care | Payer: BC Managed Care – PPO | Attending: Family Medicine | Admitting: Family Medicine

## 2019-03-12 ENCOUNTER — Telehealth: Payer: Self-pay | Admitting: Family Medicine

## 2019-03-12 ENCOUNTER — Other Ambulatory Visit: Payer: Self-pay

## 2019-03-12 DIAGNOSIS — Z3201 Encounter for pregnancy test, result positive: Secondary | ICD-10-CM

## 2019-03-12 LAB — POCT PREGNANCY, URINE: Preg Test, Ur: POSITIVE — AB

## 2019-03-12 LAB — POCT URINALYSIS DIP (DEVICE)
Bilirubin Urine: NEGATIVE
Glucose, UA: NEGATIVE mg/dL
Hgb urine dipstick: NEGATIVE
Leukocytes,Ua: NEGATIVE
Nitrite: NEGATIVE
Protein, ur: NEGATIVE mg/dL
Specific Gravity, Urine: 1.025 (ref 1.005–1.030)
Urobilinogen, UA: 1 mg/dL (ref 0.0–1.0)
pH: 7 (ref 5.0–8.0)

## 2019-03-12 LAB — POC URINE PREG, ED: Preg Test, Ur: POSITIVE — AB

## 2019-03-12 MED ORDER — PROMETHAZINE HCL 25 MG PO TABS: 25.0000 mg | ORAL_TABLET | Freq: Four times a day (QID) | ORAL | 0 refills | Status: DC | PRN

## 2019-03-12 NOTE — Telephone Encounter (Signed)
Called pt with [facility] Interpreter # [phone] and I confirmed with pt that she requesting medication for nausea.  Pt confirmed.  I advised pt that I will send Phenergan in per protocol [facility] [address]. Pt advised that the medication may cause her to be drowsy to be careful and that someone from the front office will call her to schedule an appt for nurse intake appt to gather her history.  Pt verbalized understanding.   Addison Naegeli, RN 03/12/19

## 2019-03-12 NOTE — Discharge Instructions (Signed)
You are pregnant Call OBGYN to set up an appointment Go to the [facility] for any abnormal bleeding, pain or problem

## 2019-03-12 NOTE — ED Provider Notes (Signed)
[facility]    CSN: [id number] Arrival date & time: 03/12/19  1209      History   Chief Complaint Chief Complaint  Patient presents with  . Abdominal Pain  . Possible Pregnancy    HPI Courtney Howard is a 29 y.o. female.   HPI  Patient is seen with Arabic interpreter Patient is here for pregnancy.  Pregnancy test is positive When I asked if she had any symptoms she said abdominal bloating.  I let her know that this is fairly normal.  She has a loss of appetite and nausea at times.  Other times she is eating well.  She is drinking well. Denies vaginal bleeding or other symptoms. She does have an OB/GYN in the area.  Is asked to call them for an appointment. Is reminded that she can go to the [facility] clinic ER if she has any urgent needs  Past Medical History:  Diagnosis Date  . Medical history non-contributory     Patient Active Problem List   Diagnosis Date Noted  . Active labor at term 09/02/2015  . VBAC, delivered, current hospitalization 09/02/2015  . Normal labor 09/02/2015  . Previous cesarean delivery affecting pregnancy, antepartum 03/24/2015    Past Surgical History:  Procedure Laterality Date  . CESAREAN SECTION      OB History    Gravida  4   Para  4   Term  4   Preterm  0   AB  0   Living  4     SAB  0   TAB  0   Ectopic  0   Multiple  0   Live Births  4            Home Medications    Prior to Admission medications   Medication Sig Start Date End Date Taking? Authorizing Provider  promethazine (PHENERGAN) 25 MG tablet Take 1 tablet (25 mg total) by mouth every 6 (six) hours as needed for nausea or vomiting. 03/12/19   Levie Heritage, DO    Family History Family History  Problem Relation Age of Onset  . Healthy Mother   . Healthy Father     Social History Social History   Tobacco Use  . Smoking status: Never Smoker  . Smokeless tobacco: Never Used  Substance Use Topics  . Alcohol use: No  . Drug  use: No     Allergies   Patient has no known allergies.   Review of Systems Review of Systems  Constitutional: Positive for appetite change.       Decreased appetite  Gastrointestinal: Positive for abdominal distention.       Bloating  Genitourinary: Positive for menstrual problem.       Amenorrhea     Physical Exam Triage Vital Signs ED Triage Vitals  Enc Vitals Group     BP 03/12/19 1330 104/68     Pulse Rate 03/12/19 1330 72     Resp 03/12/19 1330 15     Temp 03/12/19 1330 98.2 F (36.8 C)     Temp Source 03/12/19 1330 Oral     SpO2 03/12/19 1330 99 %     Weight --      Height --      Head Circumference --      Peak Flow --      Pain Score 03/12/19 1328 1     Pain Loc --      Pain Edu? --  Excl. in GC? --    No data found.  Updated Vital Signs BP 104/68 (BP Location: Right Arm)   Pulse 72   Temp 98.2 F (36.8 C) (Oral)   Resp 15   SpO2 99%       Physical Exam Constitutional:      General: She is not in acute distress.    Appearance: She is well-developed and normal weight.  HENT:     Head: Normocephalic and atraumatic.  Eyes:     Conjunctiva/sclera: Conjunctivae normal.     Pupils: Pupils are equal, round, and reactive to light.  Cardiovascular:     Rate and Rhythm: Normal rate.  Pulmonary:     Effort: Pulmonary effort is normal. No respiratory distress.  Musculoskeletal:        General: Normal range of motion.     Cervical back: Normal range of motion.  Skin:    General: Skin is warm and dry.  Neurological:     Mental Status: She is alert.      UC Treatments / Results  Labs (all labs ordered are listed, but only abnormal results are displayed) Labs Reviewed  POC URINE PREG, ED - Abnormal; Notable for the following components:      Result Value   Preg Test, Ur POSITIVE (*)    All other components within normal limits  POCT URINALYSIS DIP (DEVICE) - Abnormal; Notable for the following components:   Ketones, ur TRACE (*)    All  other components within normal limits  POCT PREGNANCY, URINE - Abnormal; Notable for the following components:   Preg Test, Ur POSITIVE (*)    All other components within normal limits    EKG   Radiology No results found.  Procedures Procedures (including critical care time)  Medications Ordered in UC Medications - No data to display  Initial Impression / Assessment and Plan / UC Course  I have reviewed the triage vital signs and the nursing notes.  Pertinent labs & imaging results that were available during my care of the patient were reviewed by me and considered in my medical decision making (see chart for details).     Final Clinical Impressions(s) / UC Diagnoses   Final diagnoses:  Positive pregnancy test     Discharge Instructions     You are pregnant Call OBGYN to set up an appointment Go to the [facility] for any abnormal bleeding, pain or problem   ED Prescriptions    None     PDMP not reviewed this encounter.   Raylene Everts, MD 03/12/19 2026

## 2019-03-12 NOTE — Telephone Encounter (Signed)
I received a call from the patient stating she saw blood on 12/25/2018, and 01/24/2019 and then 03/06/2019 all for just one day. She has tested positive, but not sure how far she is. She would also like something for nausea. Requesting a call back today.

## 2019-03-12 NOTE — ED Triage Notes (Signed)
Patient presents to Urgent Care with complaints of lower abdominal pain and bloating as well as loss of appetite since two weeks ago. Patient reports she would like to check for pregnancy, periods have only lasted 1 day the past 2 months.

## 2019-05-02 ENCOUNTER — Inpatient Hospital Stay (HOSPITAL_COMMUNITY): Payer: BC Managed Care – PPO

## 2019-05-02 ENCOUNTER — Other Ambulatory Visit: Payer: Self-pay

## 2019-05-02 ENCOUNTER — Encounter (HOSPITAL_COMMUNITY): Payer: Self-pay | Admitting: Emergency Medicine

## 2019-05-02 ENCOUNTER — Inpatient Hospital Stay (HOSPITAL_COMMUNITY)
Admission: EM | Admit: 2019-05-02 | Discharge: 2019-05-02 | Disposition: A | Discharge status: Home / Self Care | Payer: BC Managed Care – PPO | Attending: Obstetrics & Gynecology | Admitting: Obstetrics & Gynecology

## 2019-05-02 DIAGNOSIS — O4690 Antepartum hemorrhage, unspecified, unspecified trimester: Secondary | ICD-10-CM

## 2019-05-02 DIAGNOSIS — O209 Hemorrhage in early pregnancy, unspecified: Secondary | ICD-10-CM

## 2019-05-02 DIAGNOSIS — O4691 Antepartum hemorrhage, unspecified, first trimester: Secondary | ICD-10-CM | POA: Diagnosis not present

## 2019-05-02 DIAGNOSIS — O208 Other hemorrhage in early pregnancy: Secondary | ICD-10-CM | POA: Insufficient documentation

## 2019-05-02 DIAGNOSIS — Z3A14 14 weeks gestation of pregnancy: Secondary | ICD-10-CM | POA: Insufficient documentation

## 2019-05-02 DIAGNOSIS — O418X10 Other specified disorders of amniotic fluid and membranes, first trimester, not applicable or unspecified: Secondary | ICD-10-CM

## 2019-05-02 DIAGNOSIS — O468X1 Other antepartum hemorrhage, first trimester: Secondary | ICD-10-CM

## 2019-05-02 DIAGNOSIS — Z3491 Encounter for supervision of normal pregnancy, unspecified, first trimester: Secondary | ICD-10-CM

## 2019-05-02 LAB — CBC
HCT: 38.1 % (ref 36.0–46.0)
Hemoglobin: 13 g/dL (ref 12.0–15.0)
MCH: 30.4 pg (ref 26.0–34.0)
MCHC: 34.1 g/dL (ref 30.0–36.0)
MCV: 89 fL (ref 80.0–100.0)
Platelets: 308 10*3/uL (ref 150–400)
RBC: 4.28 MIL/uL (ref 3.87–5.11)
RDW: 12.4 % (ref 11.5–15.5)
WBC: 9.2 10*3/uL (ref 4.0–10.5)
nRBC: 0 % (ref 0.0–0.2)

## 2019-05-02 LAB — URINALYSIS, ROUTINE W REFLEX MICROSCOPIC
Bacteria, UA: NONE SEEN
Bilirubin Urine: NEGATIVE
Glucose, UA: NEGATIVE mg/dL
Ketones, ur: NEGATIVE mg/dL
Leukocytes,Ua: NEGATIVE
Nitrite: NEGATIVE
Protein, ur: NEGATIVE mg/dL
Specific Gravity, Urine: 1.013 (ref 1.005–1.030)
pH: 6 (ref 5.0–8.0)

## 2019-05-02 LAB — WET PREP, GENITAL
Clue Cells Wet Prep HPF POC: NONE SEEN
Sperm: NONE SEEN
Trich, Wet Prep: NONE SEEN

## 2019-05-02 LAB — HCG, QUANTITATIVE, PREGNANCY: hCG, Beta Chain, Quant, S: 2692 m[IU]/mL — ABNORMAL HIGH (ref <5)

## 2019-05-02 LAB — HIV ANTIBODY (ROUTINE TESTING W REFLEX): HIV Screen 4th Generation wRfx: NONREACTIVE

## 2019-05-02 NOTE — ED Provider Notes (Signed)
MSE was initiated and I personally evaluated the patient and placed orders (if any) at  4:26 PM on May 02, 2019.  Chief Complaint: vaginal bleeding  HPI:   29 y/o F that is currently pregnant presenting with 3 day h/o vaginal bleeding. She is also c/o some intermittent abd pain. Denies other complaints. Has not had an [place] yet.   ROS: vaginal bleeding (one)  Physical Exam:   Gen: No distress  Neuro: Awake and Alert  Skin: Warm    Focused Exam: Abd soft and nontender.    Initiation of care has begun. The patient has been counseled on the process, plan, and necessity for staying for the completion/evaluation, and the remainder of the medical screening examination  4:28 PM Discussed case with Venia Carbon, MAU APP. She states pt can be transferred to MAU.   The patient appears stable so that the remainder of the MSE may be completed by another provider.   Rayne Du 05/02/19 1629    Linwood Dibbles, MD 05/03/19 1731

## 2019-05-02 NOTE — MAU Provider Note (Addendum)
History     CSN: [id number]  Arrival date and time: 05/02/19 1608    Chief Complaint  Patient presents with  . Vaginal Bleeding   HPI   Patient is a 29 year old female [id number], [redacted]w[redacted]d, presenting today with vaginal bleeding for 3 days. States she took a HPT 01/25/202, positive. 1  Patient states the bleeding began Sunday (03/14) and has continued since. Pt states the bleeding is liquid and is a medium amount. Patient confirms suprapubic pain occasionally. Denies cramping, vaginal discharge, dysuria, urgency, and frequency.   Interpreter was used.   RN note: Courtney Howard is a 29 y.o. at [redacted]w[redacted]d here in MAU reporting: that she is having vaginal that started Sunday afternoon. Changing one pad a day. Pt states she had her nexplanon taken out Nov. 9th and had vaginal bleeding for 3 days on dec 9th and bled one day in january LMP: 01/24/19  Onset of complaint: sunday Pain score: 0  OB History    Gravida  6   Para  4   Term  4   Preterm  0   AB  1   Living  4     SAB  1   TAB  0   Ectopic  0   Multiple  0   Live Births  4           Past Medical History:  Diagnosis Date  . Medical history non-contributory     Past Surgical History:  Procedure Laterality Date  . CESAREAN SECTION      Family History  Problem Relation Age of Onset  . Healthy Mother   . Healthy Father     Social History   Tobacco Use  . Smoking status: Never Smoker  . Smokeless tobacco: Never Used  Substance Use Topics  . Alcohol use: No  . Drug use: No    Allergies: No Known Allergies  Medications Prior to Admission  Medication Sig Dispense Refill Last Dose  . promethazine (PHENERGAN) 25 MG tablet Take 1 tablet (25 mg total) by mouth every 6 (six) hours as needed for nausea or vomiting. 30 tablet 0     Review of Systems  Constitutional: Negative for fever.  Gastrointestinal: Positive for abdominal pain.  Genitourinary: Positive for vaginal bleeding. Negative for  difficulty urinating, dysuria, frequency, pelvic pain, urgency, vaginal discharge and vaginal pain.   Physical Exam   Blood pressure 117/62, pulse 76, temperature 98.3 F (36.8 C), resp. rate 16, height 5\' 3"  (1.6 m), weight 72.1 kg, last menstrual period 01/24/2019, SpO2 100 %, currently breastfeeding.  Physical Exam  Constitutional: She is oriented to person, place, and time. She appears well-developed and well-nourished. No distress.  HENT:  Head: Normocephalic and atraumatic.  Eyes: Pupils are equal, round, and reactive to light. Conjunctivae and EOM are normal.  Respiratory: Breath sounds normal. No respiratory distress.  Genitourinary:    Uterus normal.  There is no rash, tenderness, lesion or injury on the right labia. There is no rash, tenderness, lesion or injury on the left labia. Cervix exhibits no motion tenderness and no discharge. Right adnexum displays no mass. Left adnexum displays no mass.    Vaginal bleeding present.     No vaginal discharge.  There is bleeding in the vagina.  Musculoskeletal:        General: Normal range of motion.     Cervical back: Normal range of motion and neck supple.  Neurological: She is alert and oriented to person, place,  and time.  Skin: Skin is warm and dry. She is not diaphoretic.  Psychiatric: She has a normal mood and affect. Her behavior is normal. Judgment and thought content normal.    MAU Course  Procedures  MDM Pelvic exam, U/S, CBC, BHCG quant, U/A, GC/C, HIV antibody  AB Positive  Results for orders placed or performed during the hospital encounter of 05/02/19 (from the past 24 hour(s))  Urinalysis, Routine w reflex microscopic     Status: Abnormal   Collection Time: 05/02/19  4:47 PM  Result Value Ref Range   Color, Urine YELLOW YELLOW   APPearance CLEAR CLEAR   Specific Gravity, Urine 1.013 1.005 - 1.030   pH 6.0 5.0 - 8.0   Glucose, UA NEGATIVE NEGATIVE mg/dL   Hgb urine dipstick SMALL (A) NEGATIVE   Bilirubin  Urine NEGATIVE NEGATIVE   Ketones, ur NEGATIVE NEGATIVE mg/dL   Protein, ur NEGATIVE NEGATIVE mg/dL   Nitrite NEGATIVE NEGATIVE   Leukocytes,Ua NEGATIVE NEGATIVE   RBC / HPF 6-10 0 - 5 RBC/hpf   WBC, UA 0-5 0 - 5 WBC/hpf   Bacteria, UA NONE SEEN NONE SEEN  hCG, quantitative, pregnancy     Status: Abnormal   Collection Time: 05/02/19  6:36 PM  Result Value Ref Range   hCG, Beta Chain, Quant, S 2,692 (H) <5 mIU/mL  CBC     Status: None   Collection Time: 05/02/19  6:36 PM  Result Value Ref Range   WBC 9.2 4.0 - 10.5 K/uL   RBC 4.28 3.87 - 5.11 MIL/uL   Hemoglobin 13.0 12.0 - 15.0 g/dL   HCT 13.0 86.5 - 78.4 %   MCV 89.0 80.0 - 100.0 fL   MCH 30.4 26.0 - 34.0 pg   MCHC 34.1 30.0 - 36.0 g/dL   RDW 69.6 29.5 - 28.4 %   Platelets 308 150 - 400 K/uL   nRBC 0.0 0.0 - 0.2 %  HIV Antibody (routine testing w rflx)     Status: None   Collection Time: 05/02/19  6:36 PM  Result Value Ref Range   HIV Screen 4th Generation wRfx NON REACTIVE NON REACTIVE  Wet prep, genital     Status: Abnormal   Collection Time: 05/02/19  6:43 PM   Specimen: Cervix  Result Value Ref Range   Yeast Wet Prep HPF POC PRESENT (A) NONE SEEN   Trich, Wet Prep NONE SEEN NONE SEEN   Clue Cells Wet Prep HPF POC NONE SEEN NONE SEEN   WBC, Wet Prep HPF POC FEW (A) NONE SEEN   Sperm NONE SEEN    US OB LESS THAN 14 WEEKS WITH OB TRANSVAGINAL  Result Date: 05/02/2019 CLINICAL DATA:  Vaginal bleeding EXAM: OBSTETRIC <14 WK [place] AND TRANSVAGINAL OB US TECHNIQUE: Both transabdominal and transvaginal ultrasound examinations were performed for complete evaluation of the gestation as well as the maternal uterus, adnexal regions, and pelvic cul-de-sac. Transvaginal technique was performed to assess early pregnancy. COMPARISON:  None. FINDINGS: Intrauterine gestational sac: Irregular fluid collection within the uterus Yolk sac:  Visualized Embryo:  Not visualized Cardiac Activity: Not visualized MSD: 12 mm   6 w   0 d Subchorionic  hemorrhage:  Moderate subchorionic hemorrhage. Maternal uterus/adnexae: Ovaries are within normal limits. Left ovary measures 2.7 x 2.2 x 1.5 cm. Right ovary measures 3 by 2.7 x 2.4 cm. IMPRESSION: 1. Single intrauterine pregnancy with visible intrauterine irregular fluid collection presumed to represent gestational sac. Yolk sac is also visible. No embryo identified. 2.  Moderate subchorionic hemorrhage Electronically Signed   By: Jasmine Pang M.D.   On: 05/02/2019 19:47    Assessment and Plan  .Single intrauterine pregnancy at [redacted]w[redacted]d by LMP Gestational sac with yolk sac seen No fetus seen, sac measures 6 weeks Moderate subchorionic hemorrhage  Discharge home BLeeding precautions Discussed results may indicate normal pregnancy with fetus not seen yet or might indicate a failed pregnancy.   Plan repeat US in a week for viability Already has appts in clinic  Aviva Signs, CNM   Nicole Kindred 05/02/2019, 7:11 PM

## 2019-05-02 NOTE — Discharge Instructions (Signed)

## 2019-05-02 NOTE — MAU Note (Signed)
.   Courtney Howard is a 29 y.o. at [redacted]w[redacted]d here in MAU reporting: that she is having vaginal that started Sunday afternoon. Changing one pad a day. Pt states she had her nexplanon taken out Nov. 9th and had vaginal bleeding for 3 days on dec 9th and bled one day in january LMP: 01/24/19  Onset of complaint: sunday Pain score: 0 Vitals:   05/02/19 1615 05/02/19 1656  BP: 113/76 117/62  Pulse: 78 76  Resp: 14 16  Temp: 98.3 F (36.8 C) 98.3 F (36.8 C)  SpO2: 100% 100%     FHT: Lab orders placed from triage: UA

## 2019-05-02 NOTE — ED Triage Notes (Signed)
Pt here for evaluation of pelvic pain and vaginal bleeding since Sunday. Positive pregnancy test on 1/25 but has no prenatal care.

## 2019-05-03 ENCOUNTER — Ambulatory Visit (INDEPENDENT_AMBULATORY_CARE_PROVIDER_SITE_OTHER): Payer: BC Managed Care – PPO | Admitting: *Deleted

## 2019-05-03 DIAGNOSIS — O3680X0 Pregnancy with inconclusive fetal viability, not applicable or unspecified: Secondary | ICD-10-CM

## 2019-05-03 LAB — GC/CHLAMYDIA PROBE AMP (~~LOC~~) NOT AT ARMC
Chlamydia: NEGATIVE
Comment: NEGATIVE
Comment: NORMAL
Neisseria Gonorrhea: NEGATIVE

## 2019-05-03 NOTE — Progress Notes (Signed)
I connected with  Courtney Howard on 05/03/19 at  8:30 AM EDT by telephone and verified that I am speaking with the correct person using two identifiers.   I discussed the limitations, risks, security and privacy concerns of performing an evaluation and management service by telephone and the availability of in person appointments. I also discussed with the patient that there may be a patient responsible charge related to this service. The patient expressed understanding and agreed to proceed.  Per chart review went to MAU yesterday with vaginal bleeding and US done that did not confirm viability. Plan per provider to repeat US in one week.  I informed patient we will not be able to do telephone visit for new ob today. We must first rescheduled another [place] for viability in one week and then will reschedule new ob intake and new ob visit once viability is established.  I scheduled [place] for first available appointment in one week for 05/11/19 at 0800. I explained she will come to the office for results afterwards. I also informed her if she has heavy bleeding or severe pain to go to [facility] MAU for evaluation. She voices understanding.  Euan Wandler,RN 05/03/2019  8:41 AM

## 2019-05-09 ENCOUNTER — Encounter: Payer: BC Managed Care – PPO | Admitting: Family Medicine

## 2019-05-11 ENCOUNTER — Ambulatory Visit (INDEPENDENT_AMBULATORY_CARE_PROVIDER_SITE_OTHER): Payer: BC Managed Care – PPO | Admitting: Medical

## 2019-05-11 ENCOUNTER — Other Ambulatory Visit: Payer: Self-pay

## 2019-05-11 ENCOUNTER — Ambulatory Visit (HOSPITAL_COMMUNITY)
Admission: RE | Admit: 2019-05-11 | Discharge: 2019-05-11 | Disposition: A | Discharge status: Home / Self Care | Payer: BC Managed Care – PPO | Source: Ambulatory Visit | Attending: Family Medicine | Admitting: Family Medicine

## 2019-05-11 ENCOUNTER — Encounter: Payer: Self-pay | Admitting: Medical

## 2019-05-11 DIAGNOSIS — O3680X0 Pregnancy with inconclusive fetal viability, not applicable or unspecified: Secondary | ICD-10-CM

## 2019-05-11 DIAGNOSIS — O039 Complete or unspecified spontaneous abortion without complication: Secondary | ICD-10-CM

## 2019-05-11 NOTE — Progress Notes (Signed)
  History:  Ms. Courtney Howard is a 29 y.o. [id number] who presents to clinic today for [place] results. The patient was seen in MAU on 05/02/19 with vaginal bleeding in pregnancy. At that time IUGS and YS were seen with moderate [facility]. The patient was scheduled for follow-up US today. The patient states after MAU visit she had heavy bleeding for 3 days with pain. The pain resolved after passing a large "clot". She denies pain or fever today. She continues to have light spotting.    The following portions of the patient's history were reviewed and updated as appropriate: allergies, current medications, family history, past medical history, social history, past surgical history and problem list.  Review of Systems:  Review of Systems  Constitutional: Negative for fever.  Gastrointestinal: Negative for abdominal pain.  Genitourinary:       + spotting      Objective:  Physical Exam LMP 01/24/2019  Physical Exam  Constitutional: She is oriented to person, place, and time. She appears well-developed and well-nourished. No distress.  Respiratory: Effort normal.  Neurological: She is alert and oriented to person, place, and time.  Skin: Skin is warm and dry. No erythema.  Psychiatric: She has a normal mood and affect.      Labs and Imaging  US OB Transvaginal  Result Date: 05/11/2019 CLINICAL DATA:  First trimester pregnancy, assess viability EXAM: TRANSVAGINAL OB ULTRASOUND TECHNIQUE: Transvaginal ultrasound was performed for complete evaluation of the gestation as well as the maternal uterus, adnexal regions, and pelvic cul-de-sac. COMPARISON:  05/02/2019 FINDINGS: Intrauterine gestational sac: Absent, no longer identified Yolk sac:  N/A Embryo:  N/A Cardiac Activity: N/A Heart Rate: N/A bpm MSD:   mm    w     d CRL:     mm    w  d                  [place] EDC: Subchorionic hemorrhage:  N/A Maternal uterus/adnexae: Retroverted uterus. No uterine mass or gestational sac. Endometrial complex 9 mm thick,  heterogeneous, with previously seen gestational sac no longer identified. Small amount of heterogeneous fluid as well as echogenic material is seen within the endometrial canal likely representing a combination of blood and products of conception. RIGHT ovary normal size and morphology, 2.8 x 3.3 x 2.1 cm. LEFT ovary normal size and morphology 2.9 x 2.2 x 1.5 cm. No free pelvic fluid or adnexal masses. IMPRESSION: Previously identified gestational sac is no longer seen consistent with failed pregnancy. Heterogeneous material consisting of a fluid, complex fluid and echogenic material is identified within the endometrial canal likely representing a combination of products of conception and blood. Electronically Signed   By: Ulyses Southward M.D.   On: 05/11/2019 08:47     Assessment & Plan:  1. Miscarriage - IUGS and YS previously seen, no longer seen today  - Beta hCG quant (ref lab); drawn - Plan to follow patient to hCG < 5 - Schedule follow-up with any provider in 1-2 weeks   Approximately 10 minutes of face-to-face time spent with patient   Courtney Howard 05/11/2019 9:35 AM

## 2019-05-11 NOTE — Patient Instructions (Signed)
Miscarriage A miscarriage is the loss of an unborn baby (fetus) before the 20th week of pregnancy. Follow these instructions at home: Medicines   Take over-the-counter and prescription medicines only as told by your doctor.  If you were prescribed antibiotic medicine, take it as told by your doctor. Do not stop taking the antibiotic even if you start to feel better.  Do not take NSAIDs unless your doctor says that this is safe for you. NSAIDs include aspirin and ibuprofen. These medicines can cause bleeding. Activity  Rest as directed. Ask your doctor what activities are safe for you.  Have someone help you at home during this time. General instructions  Write down how many pads you use each day and how soaked they are.  Watch the amount of tissue or clumps of blood (blood clots) that you pass from your vagina. Save any large amounts of tissue for your doctor.  Do not use tampons, douche, or have sex until your doctor approves.  To help you and your partner with the process of grieving, talk with your doctor or seek counseling.  When you are ready, meet with your doctor to talk about steps you should take for your health. Also, talk with your doctor about steps to take to have a healthy pregnancy in the future.  Keep all follow-up visits as told by your doctor. This is important. Contact a doctor if:  You have a fever or chills.  You have vaginal discharge that smells bad.  You have more bleeding. Get help right away if:  You have very bad cramps or pain in your back or belly.  You pass clumps of blood that are walnut-sized or larger from your vagina.  You pass tissue that is walnut-sized or larger from your vagina.  You soak more than 1 regular pad in an hour.  You get light-headed or weak.  You faint (pass out).  You have feelings of sadness that do not go away, or you have thoughts of hurting yourself. Summary  A miscarriage is the loss of an unborn baby before  the 20th week of pregnancy.  Follow your doctor's instructions for home care. Keep all follow-up appointments.  To help you and your partner with the process of grieving, talk with your doctor or seek counseling. This information is not intended to replace advice given to you by your health care provider. Make sure you discuss any questions you have with your health care provider. Document Revised: 05/26/2018 Document Reviewed: 03/09/2016 Elsevier Patient Education  2020 Elsevier Inc.  

## 2019-05-12 LAB — BETA HCG QUANT (REF LAB): hCG Quant: 59 m[IU]/mL

## 2019-05-17 ENCOUNTER — Other Ambulatory Visit: Payer: Self-pay | Admitting: General Practice

## 2019-05-17 DIAGNOSIS — O039 Complete or unspecified spontaneous abortion without complication: Secondary | ICD-10-CM

## 2019-05-21 ENCOUNTER — Other Ambulatory Visit: Payer: BC Managed Care – PPO

## 2019-06-06 ENCOUNTER — Encounter: Payer: BC Managed Care – PPO | Admitting: Obstetrics and Gynecology

## 2019-06-11 ENCOUNTER — Ambulatory Visit (INDEPENDENT_AMBULATORY_CARE_PROVIDER_SITE_OTHER): Payer: BC Managed Care – PPO | Admitting: Obstetrics and Gynecology

## 2019-06-11 ENCOUNTER — Other Ambulatory Visit: Payer: Self-pay

## 2019-06-11 ENCOUNTER — Encounter: Payer: Self-pay | Admitting: Obstetrics and Gynecology

## 2019-06-11 VITALS — BP 116/75 | HR 80 | Ht 64.0 in | Wt 160.7 lb

## 2019-06-11 DIAGNOSIS — Z3040 Encounter for surveillance of contraceptives, unspecified: Secondary | ICD-10-CM

## 2019-06-11 DIAGNOSIS — Z603 Acculturation difficulty: Secondary | ICD-10-CM

## 2019-06-11 DIAGNOSIS — Z3202 Encounter for pregnancy test, result negative: Secondary | ICD-10-CM | POA: Diagnosis not present

## 2019-06-11 DIAGNOSIS — Z30017 Encounter for initial prescription of implantable subdermal contraceptive: Secondary | ICD-10-CM | POA: Diagnosis not present

## 2019-06-11 DIAGNOSIS — O039 Complete or unspecified spontaneous abortion without complication: Secondary | ICD-10-CM | POA: Diagnosis not present

## 2019-06-11 LAB — POCT PREGNANCY, URINE: Preg Test, Ur: NEGATIVE

## 2019-06-11 MED ORDER — ETONOGESTREL 68 MG ~~LOC~~ IMPL
68.0000 mg | DRUG_IMPLANT | Freq: Once | SUBCUTANEOUS | Status: AC
  Administered 2019-06-11: 68 mg via SUBCUTANEOUS

## 2019-06-11 NOTE — Progress Notes (Signed)
GYNECOLOGY OFFICE PROCEDURE NOTE   Ms. Courtney Howard is a 29 y.o. [id number] here for Nexplanon insertion. No complaints. UPT ordered today.  BP 116/75   Pulse 80   Ht 5\' 4"  (1.626 m)   Wt 160 lb 11.2 oz (72.9 kg)   LMP 01/24/2019 Comment: SAB  Breastfeeding Unknown   BMI 27.58 kg/m    No results found for this or any previous visit (from the past 24 hour(s)).    Nexplanon Insertion Procedure Patient identified, informed consent performed, consent signed.   Patient does understand that irregular bleeding is a very common side effect of this medication. She was advised to have backup contraception for one week after placement. Pregnancy test in clinic today was negative.  Appropriate time out taken.  Patient's left arm was prepped and draped in the usual sterile fashion. The ruler used to measure and mark insertion area.  Patient was prepped with alcohol swab and then injected with 3 ml of 1% lidocaine.  She was prepped with betadine, Nexplanon removed from packaging,  Device confirmed in needle, then inserted full length of needle and withdrawn per handbook instructions. Nexplanon was able to palpated in the patient's arm; patient palpated the insert herself. There was minimal blood loss.  Patient insertion site covered with guaze and a pressure bandage to reduce any bruising.  The patient tolerated the procedure well and was given post procedure instructions. Follow-up in-office (due to language barrier) in one month.  Nexplanon Lot#: [id number] / Expiration Date: 08/15/2021  10/16/2021, CNM  06/11/2019 4:02 PM

## 2019-06-11 NOTE — Patient Instructions (Signed)
Nexplanon Instructions After Insertion  Keep bandage clean and dry for 24 hours  May use ice/Tylenol/Ibuprofen for soreness or pain  If you develop fever, drainage or increased warmth from incision site-contact office immediately   

## 2019-06-11 NOTE — Progress Notes (Signed)
  GYNECOLOGY PROGRESS NOTE  History:  Ms. Courtney Howard is a 29 y.o. [id number] presents to [facility] office today for f/u to SAB. She reports her VB stopped on 3/26. She reports her period started back on 06/11/19. She desires a Nexplanon today. She denies h/a, dizziness, shortness of breath, n/v, or fever/chills.    The following portions of the patient's history were reviewed and updated as appropriate: allergies, current medications, past family history, past medical history, past social history, past surgical history and problem list.   Review of Systems:  Pertinent items are noted in HPI.   Objective:  Physical Exam Blood pressure 116/75, pulse 80, height 5\' 4"  (1.626 m), weight 160 lb 11.2 oz (72.9 kg), last menstrual period 01/24/2019, unknown if currently breastfeeding. VS reviewed, nursing note reviewed,  Constitutional: well developed, well nourished, no distress HEENT: normocephalic CV: normal rate Pulm/chest wall: normal effort Breast Exam: deferred Abdomen: soft Neuro: alert and oriented x 3 Skin: warm, dry Psych: affect normal Pelvic exam: Cervix pink, visually closed, without lesion, scant white creamy discharge, vaginal walls and external genitalia normal Bimanual exam: Cervix 0/long/high, firm, anterior, neg CMT, uterus nontender, nonenlarged, adnexa without tenderness, enlargement, or mass  Assessment & Plan:  There are no diagnoses linked to this encounter.  14/10/2018, CNM 3:38 PM

## 2019-06-12 ENCOUNTER — Encounter: Payer: Self-pay | Admitting: *Deleted

## 2019-07-10 ENCOUNTER — Ambulatory Visit (INDEPENDENT_AMBULATORY_CARE_PROVIDER_SITE_OTHER): Payer: BC Managed Care – PPO | Admitting: Nurse Practitioner

## 2019-07-10 ENCOUNTER — Other Ambulatory Visit: Payer: Self-pay

## 2019-07-10 ENCOUNTER — Other Ambulatory Visit (HOSPITAL_COMMUNITY)
Admission: RE | Admit: 2019-07-10 | Discharge: 2019-07-10 | Disposition: A | Discharge status: Home / Self Care | Payer: BC Managed Care – PPO | Source: Ambulatory Visit | Attending: Nurse Practitioner | Admitting: Nurse Practitioner

## 2019-07-10 ENCOUNTER — Encounter: Payer: Self-pay | Admitting: Nurse Practitioner

## 2019-07-10 VITALS — BP 125/74 | HR 80 | Wt 161.3 lb

## 2019-07-10 DIAGNOSIS — Z3046 Encounter for surveillance of implantable subdermal contraceptive: Secondary | ICD-10-CM | POA: Diagnosis not present

## 2019-07-10 DIAGNOSIS — Z01419 Encounter for gynecological examination (general) (routine) without abnormal findings: Secondary | ICD-10-CM | POA: Insufficient documentation

## 2019-07-10 NOTE — Progress Notes (Signed)
   GYNECOLOGY OFFICE VISIT NOTE   History:  29 y.o. [id number] here today for pap smear and to have her Nexplanon checked.  She has some periodic general pain in her upper left arm and wonders if her Nexplanon is OK. She denies any abnormal vaginal discharge, bleeding, pelvic pain or other concerns.   Past Medical History:  Diagnosis Date  . Medical history non-contributory     Past Surgical History:  Procedure Laterality Date  . CESAREAN SECTION      The following portions of the patient's history were reviewed and updated as appropriate: allergies, current medications, past family history, past medical history, past social history, past surgical history and problem list.   Health Maintenance:  Normal pap in 2015.  Pap done today.   Review of Systems:  Pertinent items noted in HPI and remainder of comprehensive ROS otherwise negative.  Objective:  Physical Exam BP 125/74   Pulse 80   Wt 161 lb 4.8 oz (73.2 kg)   LMP 06/10/2019 (LMP Unknown)   BMI 27.69 kg/m  CONSTITUTIONAL: Well-developed, well-nourished female in no acute distress.  HENT:  Normocephalic, atraumatic. External right and left ear normal.  EYES: Conjunctivae and EOM are normal. Pupils are equal, round.  No scleral icterus.  NECK: Normal range of motion, supple, no masses SKIN: Skin is warm and dry. No rash noted. Not diaphoretic. No erythema. No pallor. NEUROLOGIC: Alert and oriented to person, place, and time. Normal muscle tone coordination. No cranial nerve deficit noted. PSYCHIATRIC: Normal mood and affect. Normal behavior. Normal judgment and thought content. CARDIOVASCULAR: Normal heart rate noted RESPIRATORY: Effort and breath sounds normal, no problems with respiration noted ABDOMEN: Soft, no distention noted.   PELVIC: Normal appearing external genitalia; normal appearing vaginal mucosa and cervix.  No abnormal discharge noted.  Normal uterine size, no other palpable masses, no uterine or adnexal  tenderness.  Pap done MUSCULOSKELETAL: Normal range of motion. No edema noted. Breast exam declined.  Labs and Imaging No results found.  Assessment & Plan:  1. Annual exam No problems.  Nexplanon palpated and is intact in upper, inner left arm in the place of insertion near to the hyperpigmented area where insertion was done.  No pain when palpating the rod. Client has had her first Covid vaccine and is scheduled for the second Covid vaccine.  She was worried it would interefere with fertility, but reassured client that vaccine is not know to interfere with fertility.  - Cytology - PAP( [facility])  2.  Pain with Nexplanon - checking See above  Routine preventative health maintenance measures emphasized. Please refer to After Visit Summary for other counseling recommendations.   Return in about 1 year (around 07/09/2020).   Total face-to-face time with patient: 15 minutes.  Over 50% of encounter was spent on counseling and coordination of care.  Nolene Bernheim, RN, MSN, NP-BC Nurse Practitioner, [facility] for [employer], [facility] 07/10/2019 4:20 PM

## 2019-07-10 NOTE — Progress Notes (Signed)
Check Nexplanon and Papsmear.

## 2019-07-11 LAB — CYTOLOGY - PAP
Chlamydia: NEGATIVE
Comment: NEGATIVE
Comment: NEGATIVE
Comment: NORMAL
Diagnosis: NEGATIVE
High risk HPV: NEGATIVE
Neisseria Gonorrhea: NEGATIVE

## 2020-03-06 ENCOUNTER — Other Ambulatory Visit: Payer: Self-pay

## 2020-03-06 ENCOUNTER — Ambulatory Visit (INDEPENDENT_AMBULATORY_CARE_PROVIDER_SITE_OTHER): Payer: BC Managed Care – PPO | Admitting: Obstetrics & Gynecology

## 2020-03-06 ENCOUNTER — Encounter: Payer: Self-pay | Admitting: Obstetrics & Gynecology

## 2020-03-06 VITALS — BP 127/80 | HR 93 | Wt 169.4 lb

## 2020-03-06 DIAGNOSIS — M542 Cervicalgia: Secondary | ICD-10-CM | POA: Diagnosis not present

## 2020-03-06 DIAGNOSIS — Z3046 Encounter for surveillance of implantable subdermal contraceptive: Secondary | ICD-10-CM

## 2020-03-06 MED ORDER — IBUPROFEN 600 MG PO TABS: 600.0000 mg | ORAL_TABLET | Freq: Four times a day (QID) | ORAL | 1 refills | Status: DC | PRN

## 2020-03-06 NOTE — Progress Notes (Signed)
[facility] in-person Interpreter for pt.

## 2020-03-06 NOTE — Progress Notes (Signed)
Patient ID: Courtney Howard, female   DOB: 10-29-1990, 30 y.o.   MRN: [id number]  Chief Complaint  Patient presents with  . Contraception  . Follow-up    HPI Courtney Howard is a 30 y.o. female.  [id number] No LMP recorded (lmp unknown). Patient has had an implant. Nexplanon in place since 06/11/19 and she has no problem with vaginal bleeding. For 3 month she has had had posterior neck pain, left shoulder pain and pain down left arm. She gets some relief with Tylenol or ibuprofen, no c/o neck stiffness, no injury. She is concerned the implant is the cause. HPI  Past Medical History:  Diagnosis Date  . Medical history non-contributory     Past Surgical History:  Procedure Laterality Date  . CESAREAN SECTION      Family History  Problem Relation Age of Onset  . Healthy Mother   . Healthy Father     Social History Social History   Tobacco Use  . Smoking status: Never Smoker  . Smokeless tobacco: Never Used  Vaping Use  . Vaping Use: Never used  Substance Use Topics  . Alcohol use: No  . Drug use: No    No Known Allergies  Current Outpatient Medications  Medication Sig Dispense Refill  . ibuprofen (ADVIL) 400 MG tablet Take 400 mg by mouth every 6 (six) hours as needed.    Marland Kitchen ibuprofen (ADVIL) 600 MG tablet Take 1 tablet (600 mg total) by mouth every 6 (six) hours as needed. 30 tablet 1   No current facility-administered medications for this visit.    Review of Systems Review of Systems  Constitutional: Negative.   Respiratory: Negative.   Genitourinary: Negative for menstrual problem, pelvic pain and vaginal bleeding.  Musculoskeletal: Positive for neck pain. Negative for neck stiffness.       Headache and arm pain all on the left    Blood pressure 127/80, pulse 93, weight 169 lb 6.4 oz (76.8 kg), unknown if currently breastfeeding.  Physical Exam Physical Exam Constitutional:      Appearance: Normal appearance. She is not ill-appearing.  HENT:     Head:  Normocephalic and atraumatic.  Pulmonary:     Effort: Pulmonary effort is normal.  Musculoskeletal:        General: No swelling (left upper arm implant palpable at insertion site not tender or swollen). Normal range of motion.     Cervical back: Normal range of motion and neck supple. No rigidity or tenderness.  Neurological:     Mental Status: She is alert.     Data Reviewed Insertion visit report  Assessment MS pain in left neck shoulder and arm very unlikely due to Nexplanon  Plan Referral to rehab for PT evaluation of pain  Meds ordered this encounter  Medications  . ibuprofen (ADVIL) 600 MG tablet    Sig: Take 1 tablet (600 mg total) by mouth every 6 (six) hours as needed.    Dispense:  30 tablet    Refill:  1       Scheryl Darter 03/06/2020, 2:19 PM

## 2020-03-06 NOTE — Patient Instructions (Signed)
Cervical Sprain A cervical sprain is a stretch or tear in one or more of the ligaments in the neck. Ligaments are the tissues that connect bones. Cervical sprains can range from mild to severe. Severe cervical sprains can cause the spinal bones (vertebrae) in the neck to be unstable. This can result in spinal cord damage and in serious nervous system problems. The time that it takes for a cervical sprain to heal depends on the cause and extent of the injury. Most cervical sprains heal in 4-6 weeks. What are the causes? Cervical sprains may be caused by trauma, such as an injury from a motor vehicle accident, a fall, or a sudden forward and backward whipping movement of the head and neck (whiplash injury). Mild cervical sprains may be caused by wear and tear over time. What increases the risk? The following factors may make you more likely to develop this condition:  Participating in activities that have a high risk of trauma to the neck. These include contact sports, auto racing, gymnastics, and diving.  Taking risks when driving or riding in a motor vehicle.  Osteoarthritis of the spine.  Poor strength and flexibility of the neck.  A previous neck injury.  Poor posture.  Spending long periods in certain positions that put stress on the neck, such as sitting at a computer for a long time. What are the signs or symptoms? Symptoms of this condition include:  Pain, soreness, stiffness, tenderness, swelling, or a burning sensation in the front, back, or sides of the neck, shoulders, or upper back.  Sudden tightening of neck muscles (spasms).  Limited ability to move the neck.  Headache.  Dizziness.  Nausea or vomiting.  Weakness, numbness, or tingling in a hand or an arm. Symptoms may develop right away after injury, or they may develop over a few days. In some cases, symptoms may go away with treatment and return (recur) over time. How is this diagnosed? This condition may be  diagnosed based on:  Your medical history.  Your symptoms.  Any recent injuries or known neck problems that you have, such as arthritis in the neck.  A physical exam.  Imaging tests, such as X-rays, MRI, and CT scan. How is this treated? This condition is treated by resting and icing the injured area and doing physical therapy exercises. Heat therapy may be used 2-3 days after the injury occurred if there is no swelling. Depending on the severity of your condition, treatment may also include:  Keeping your neck in place (immobilized) for periods of time. This may be done using: ? A cervical collar. This supports your chin and the back of your head. ? A cervical traction device. This is a sling that holds up your head. The device removes weight and pressure from your neck, and it may help to relieve pain.  Medicines that help to relieve pain and inflammation.  Medicines that help to relax your muscles (muscle relaxants).  Surgery. This is rare. Follow these instructions at home: Medicines  Take over-the-counter and prescription medicines only as told by your health care provider.  Ask your health care provider if the medicine prescribed to you: ? Requires you to avoid driving or using heavy machinery. ? Can cause constipation. You may need to take these actions to prevent or treat constipation:  Drink enough fluid to keep your urine pale yellow.  Take over-the-counter or prescription medicines.  Eat foods that are high in fiber, such as beans, whole grains, and fresh fruits   and vegetables.  Limit foods that are high in fat and processed sugars, such as fried or sweet foods.   If you have a cervical collar:  Wear the collar as told by your health care provider. Do not remove it unless told.  Ask before making any adjustments to your collar.  If you have long hair, keep it outside of the collar.  Ask your health care provider if you may remove the collar for cleaning and  bathing. If so: ? Follow instructions about how to remove it safely. ? Clean it by hand with mild soap and water and air-dry it completely. ? If your collar has removable pads, remove them every 1-2 days and wash them by hand with soap and water. Let them air-dry completely before putting them back in the collar.  Tell your health care provider if your skin under the collar has irritation or sores. Managing pain, stiffness, and swelling  If directed, use a cervical traction device as told.  If directed, put ice on the affected area. To do this: ? Put ice in a plastic bag. ? Place a towel between your skin and the bag. ? Leave the ice on for 20 minutes, 2-3 times a day.  If directed, apply heat to the affected area before you do your physical therapy or as often as told by your health care provider. Use the heat source that your health care provider recommends, such as a moist heat pack or a heating pad. ? Place a towel between your skin and the heat source. ? Leave the heat on for 20-30 minutes. ? Remove the heat if your skin turns bright red. This is especially important if you are unable to feel pain, heat, or cold. You may have a greater risk of getting burned.      Activity  Do not drive while wearing a cervical collar. If you do not have a cervical collar, ask if it is safe to drive while your neck heals.  Do not lift anything that is heavier than 10 lb (4.5 kg), or the limit that you are told, until your health care provider says that it is safe.  Rest as told by your health care provider.  If physical therapy was prescribed, do exercises as told by your health care provider or physical therapist.  Return to your normal activities as told by your health care provider. Avoid positions and activities that make your symptoms worse. Ask your health care provider what activities are safe for you. General instructions  Do not use any products that contain nicotine or tobacco, such  as cigarettes, e-cigarettes, and chewing tobacco. These can delay healing. If you need help quitting, ask your health care provider.  Keep all follow-up visits as told by your health care provider or physical therapist. This is important. How is this prevented? To prevent a cervical sprain from happening again:  Use and maintain good posture. Make any needed adjustments to your workstation to help you do this.  Exercise regularly as told by your health care provider or physical therapist.  Avoid risky activities that may cause a cervical sprain. Contact a health care provider if you have:  Symptoms that get worse or do not get better after 2 weeks of treatment.  Pain that gets worse or does not get better with medicine.  New, unexplained symptoms.  Sores or irritated skin on your neck from wearing your cervical collar. Get help right away if:  You have severe pain.    You develop numbness, tingling, or weakness in any part of your body.  You cannot move a part of your body (you have paralysis).  You have neck pain along with severe dizziness or headache. Summary  A cervical sprain is a stretch or tear in one or more of the ligaments in the neck.  Cervical sprains may be caused by trauma, such as an injury from a motor vehicle accident, a fall, or a sudden forward and backward whipping movement of the head and neck (whiplash injury).  Symptoms may develop right away after injury, or they may develop over a few days.  This condition may be treated with rest, ice, heat, medicines, physical therapy, and surgery. This information is not intended to replace advice given to you by your health care provider. Make sure you discuss any questions you have with your health care provider. Document Revised: 10/11/2018 Document Reviewed: 10/11/2018 Elsevier Patient Education  2021 [employer]  

## 2020-08-15 ENCOUNTER — Ambulatory Visit (HOSPITAL_COMMUNITY)
Admission: EM | Admit: 2020-08-15 | Discharge: 2020-08-15 | Disposition: A | Discharge status: Home / Self Care | Payer: BC Managed Care – PPO | Attending: Family Medicine | Admitting: Family Medicine

## 2020-08-15 ENCOUNTER — Other Ambulatory Visit: Payer: Self-pay

## 2020-08-15 ENCOUNTER — Encounter (HOSPITAL_COMMUNITY): Payer: Self-pay | Admitting: Emergency Medicine

## 2020-08-15 DIAGNOSIS — M67432 Ganglion, left wrist: Secondary | ICD-10-CM | POA: Diagnosis not present

## 2020-08-15 DIAGNOSIS — M5412 Radiculopathy, cervical region: Secondary | ICD-10-CM | POA: Diagnosis not present

## 2020-08-15 MED ORDER — NAPROXEN 500 MG PO TABS: 500.0000 mg | ORAL_TABLET | Freq: Two times a day (BID) | ORAL | 0 refills | Status: AC | PRN

## 2020-08-15 MED ORDER — CYCLOBENZAPRINE HCL 5 MG PO TABS: 5.0000 mg | ORAL_TABLET | Freq: Two times a day (BID) | ORAL | 0 refills | Status: AC | PRN

## 2020-08-15 NOTE — ED Provider Notes (Signed)
[facility]    CSN: [id number] Arrival date & time: 08/15/20  1655      History   Chief Complaint Chief Complaint  Patient presents with   Wrist Pain    left    HPI Courtney Howard is a 30 y.o. female.   Patient presenting today with several month history of a painful hard mass on her left wrist.  She denies any injury, diffuse swelling, discoloration, numbness or tingling.  Has not tried anything over-the-counter for symptoms.  She is also having a month or so of intermittent issues with pain shooting down the left arm and leg, worse after sleeping on this side or with movement.  Denies numbness, tingling, weakness, injury to the areas, swelling, discoloration.  Has not tried anything over-the-counter for symptoms.    Past Medical History:  Diagnosis Date   Medical history non-contributory     Patient Active Problem List   Diagnosis Date Noted   Checking subdermal contraceptive 07/10/2019   History of VBAC 09/02/2015   History of cesarean delivery 03/24/2015    Past Surgical History:  Procedure Laterality Date   CESAREAN SECTION      OB History     Gravida  6   Para  4   Term  4   Preterm  0   AB  1   Living  4      SAB  1   IAB  0   Ectopic  0   Multiple  0   Live Births  4          Home Medications    Prior to Admission medications   Medication Sig Start Date End Date Taking? Authorizing Provider  cyclobenzaprine (FLEXERIL) 5 MG tablet Take 1 tablet (5 mg total) by mouth 2 (two) times daily as needed for muscle spasms. 08/15/20  Yes Particia Nearing, PA-C  naproxen (NAPROSYN) 500 MG tablet Take 1 tablet (500 mg total) by mouth 2 (two) times daily as needed. 08/15/20  Yes Particia Nearing, PA-C    Family History Family History  Problem Relation Age of Onset   Healthy Mother    Healthy Father     Social History Social History   Tobacco Use   Smoking status: Never   Smokeless tobacco: Never  Vaping Use    Vaping Use: Never used  Substance Use Topics   Alcohol use: No   Drug use: No     Allergies   Patient has no known allergies.   Review of Systems Review of Systems Per HPI  Physical Exam Triage Vital Signs ED Triage Vitals  Enc Vitals Group     BP 08/15/20 1749 117/76     Pulse Rate 08/15/20 1749 69     Resp 08/15/20 1749 18     Temp 08/15/20 1749 99.1 F (37.3 C)     Temp Source 08/15/20 1749 Oral     SpO2 08/15/20 1749 100 %     Weight --      Height --      Head Circumference --      Peak Flow --      Pain Score 08/15/20 1757 6     Pain Loc --      Pain Edu? --      Excl. in GC? --    No data found.  Updated Vital Signs BP 117/76   Pulse 69   Temp 99.1 F (37.3 C) (Oral)   Resp 18  SpO2 100%   Visual Acuity Right Eye Distance:   Left Eye Distance:   Bilateral Distance:    Right Eye Near:   Left Eye Near:    Bilateral Near:     Physical Exam Vitals and nursing note reviewed.  Constitutional:      Appearance: Normal appearance. She is not ill-appearing.  HENT:     Head: Atraumatic.  Eyes:     Extraocular Movements: Extraocular movements intact.     Conjunctiva/sclera: Conjunctivae normal.  Cardiovascular:     Rate and Rhythm: Normal rate and regular rhythm.     Heart sounds: Normal heart sounds.  Pulmonary:     Effort: Pulmonary effort is normal.     Breath sounds: Normal breath sounds.  Musculoskeletal:        General: Normal range of motion.     Cervical back: Normal range of motion and neck supple.     Comments: Firm cystic structure to volar aspect of left wrist, mildly tender to palpation.  Good range of motion and strength in this extremity.  Good range of motion all 4 extremities, strength full and equal bilateral extremities upper and lower.  Negative straight leg raise.  No midline spinal tenderness palpation diffusely  Skin:    General: Skin is warm and dry.     Findings: No bruising or erythema.  Neurological:     Mental  Status: She is alert and oriented to person, place, and time.     Comments: Bilateral upper and lower EXTR neurovascularly intact  Psychiatric:        Mood and Affect: Mood normal.        Thought Content: Thought content normal.        Judgment: Judgment normal.     UC Treatments / Results  Labs (all labs ordered are listed, but only abnormal results are displayed) Labs Reviewed - No data to display  EKG   Radiology No results found.  Procedures Procedures (including critical care time)  Medications Ordered in UC Medications - No data to display  Initial Impression / Assessment and Plan / UC Course  I have reviewed the triage vital signs and the nursing notes.  Pertinent labs & imaging results that were available during my care of the patient were reviewed by me and considered in my medical decision making (see chart for details).     Consistent with ganglion cyst, discussed orthopedic follow-up given duration and pain associated.  Information given for orthopedics.  Suspect some radicular inflammatory/muscle involvement causing the left-sided pain.  Naproxen, Flexeril, stretches and exercises recommended.  Return for acutely worsening symptoms.  Final Clinical Impressions(s) / UC Diagnoses   Final diagnoses:  Ganglion cyst of volar aspect of left wrist  Left cervical radiculopathy   Discharge Instructions   None    ED Prescriptions     Medication Sig Dispense Auth. Provider   naproxen (NAPROSYN) 500 MG tablet Take 1 tablet (500 mg total) by mouth 2 (two) times daily as needed. 30 tablet Particia Nearing, [place]   cyclobenzaprine (FLEXERIL) 5 MG tablet Take 1 tablet (5 mg total) by mouth 2 (two) times daily as needed for muscle spasms. 10 tablet Particia Nearing, [place]      PDMP not reviewed this encounter.   Particia Nearing, [place] 08/15/20 1858

## 2020-08-15 NOTE — ED Triage Notes (Signed)
Pt presents with left arm/ wrist pain. States has a hard knot/bump of left wrist. Also states that has pain from left side of neck into arm. States has BC implant in left arm that was placed in 05/2019.

## 2020-10-05 IMAGING — US US OB TRANSVAGINAL
1 series · 15 of 28 positions shown · non-contrast
Comparison: 05/02/2019

CLINICAL DATA: First trimester pregnancy, assess viability

EXAM:
TRANSVAGINAL OB ULTRASOUND
TECHNIQUE: Transvaginal ultrasound was performed for complete evaluation of the
gestation as well as the maternal uterus, adnexal regions, and
pelvic cul-de-sac.

[Series 1: us ob transvaginal · 15 of 30 slices shown]
[im 1/30]
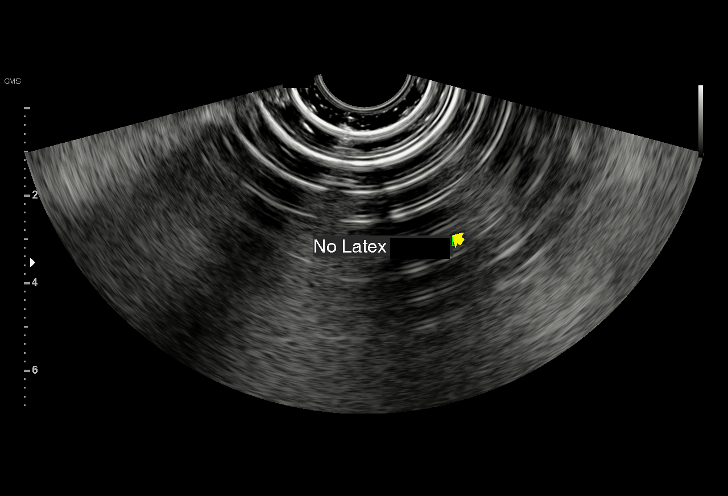
[im 3/30]
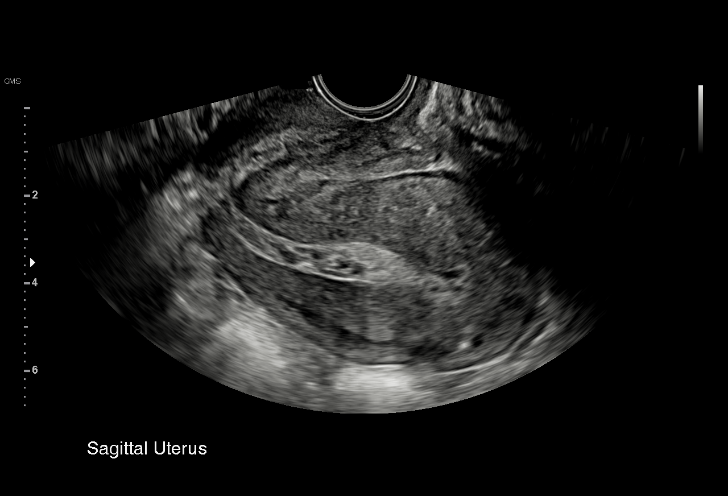
[im 5/30]
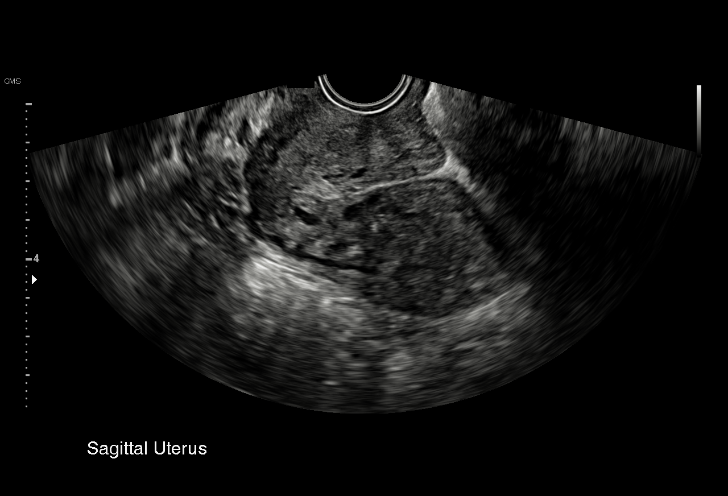
[im 7/30]
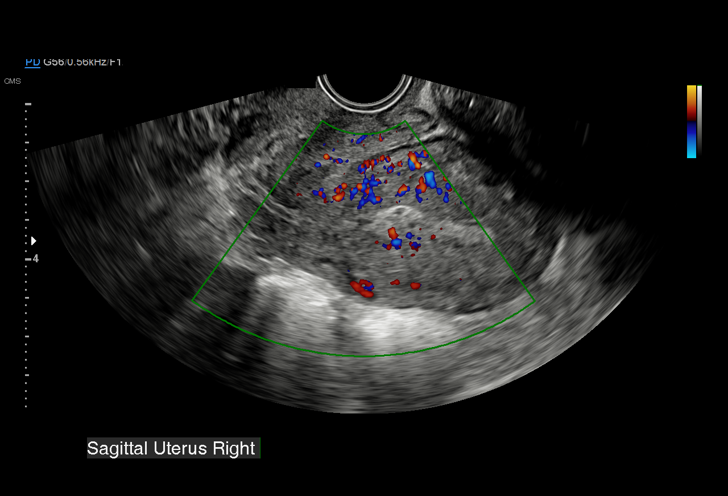
[im 9/30]
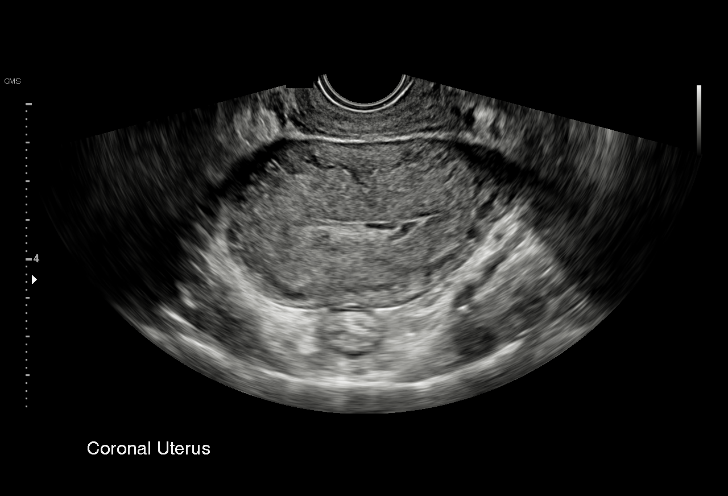
[im 11/30]
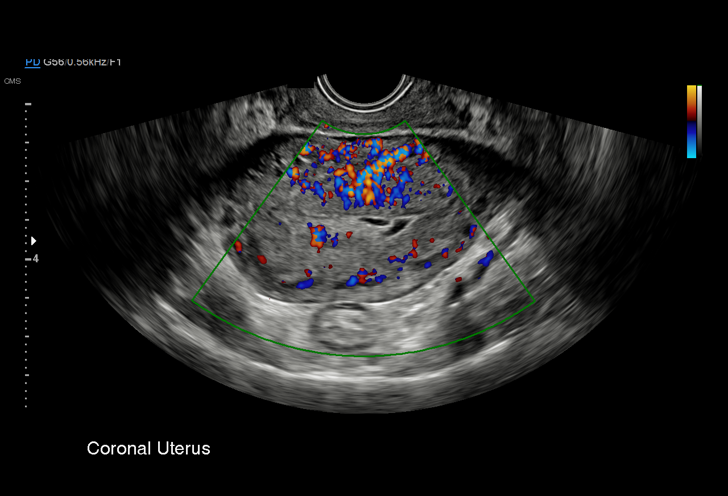
[im 13/30]
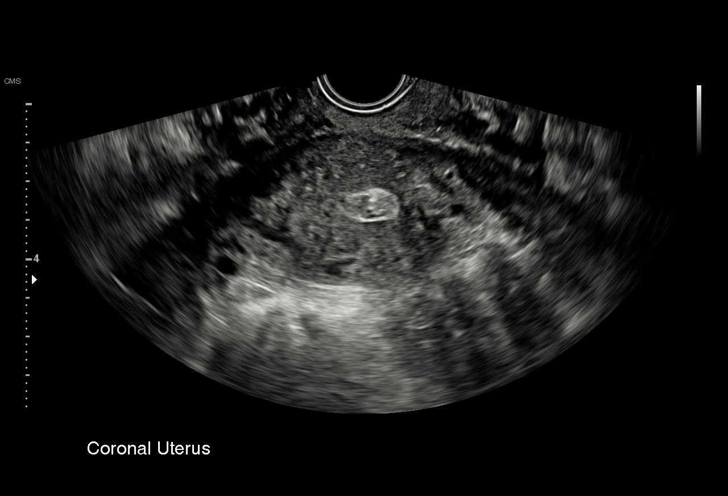
[im 16/30]
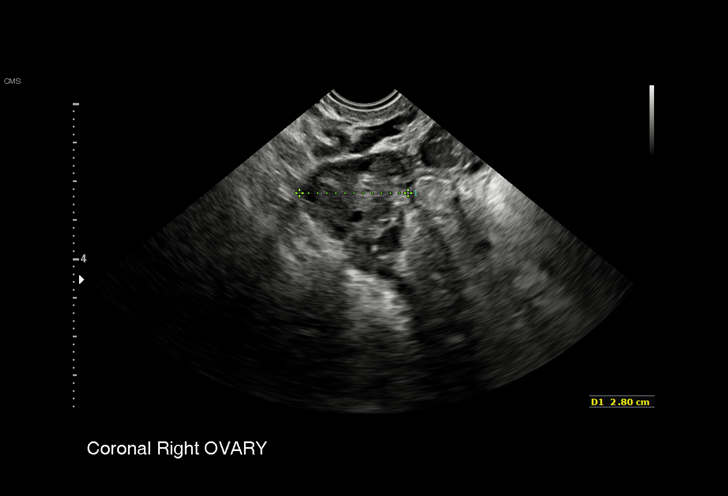
[im 17/30]
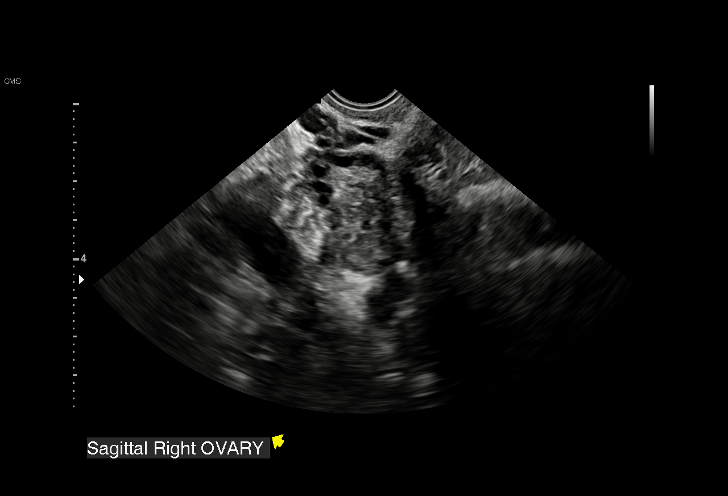
[im 19/30]
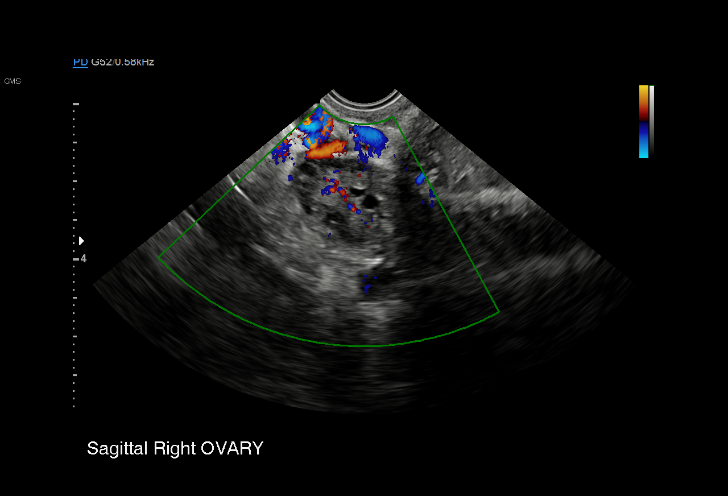
[im 21/30]
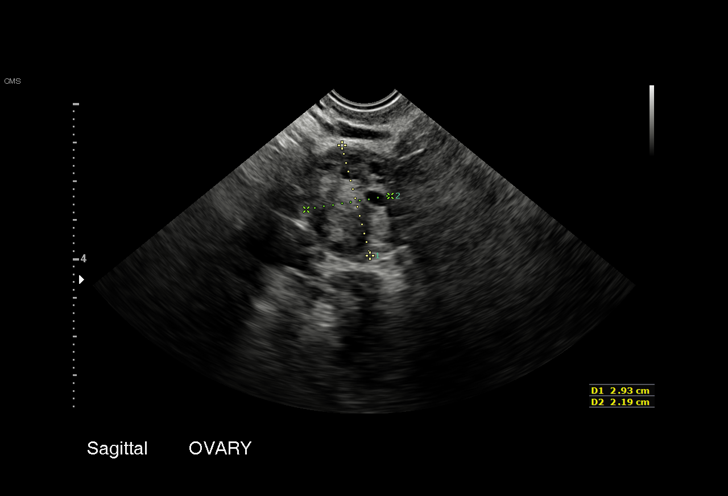
[im 23/30]
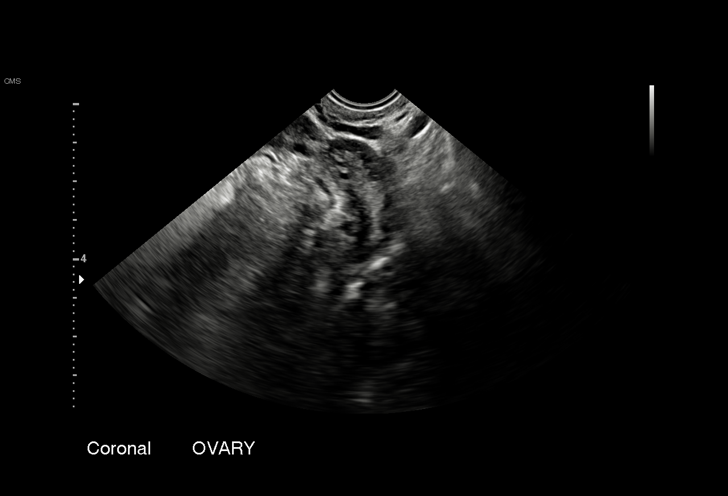
[im 25/30]
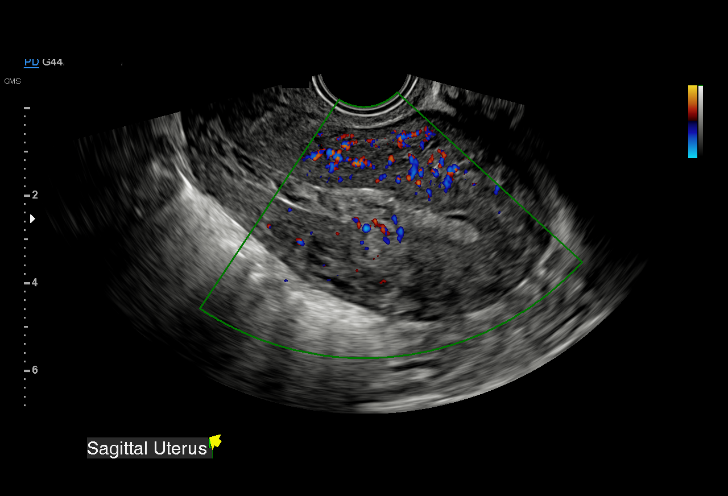
[im 27/30]
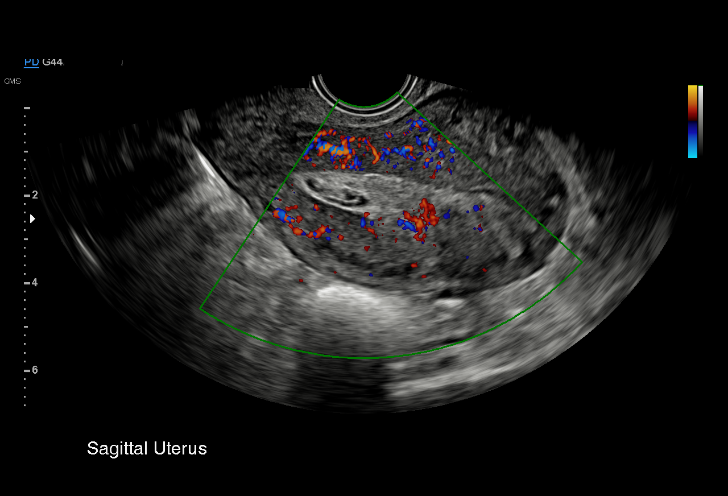
[im 30/30]
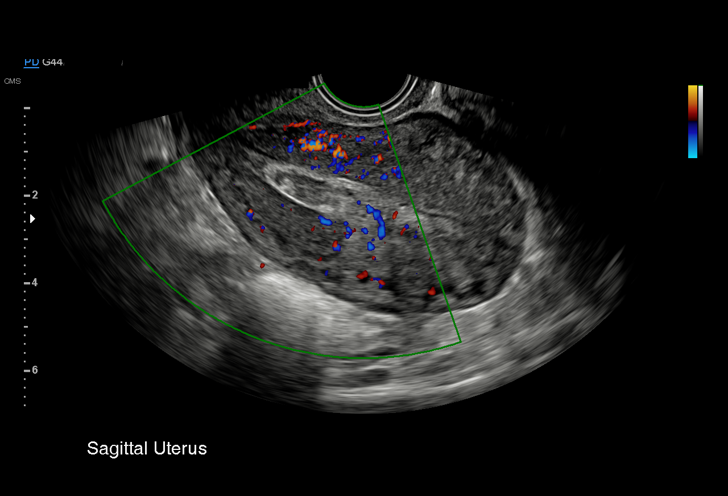

[15 of 28 positions shown; findings below may reference images not displayed]

FINDINGS: Intrauterine gestational sac: Absent, no longer identified

Yolk sac:  N/A

Embryo:  N/A

Cardiac Activity: N/A

Heart Rate: N/A bpm

MSD:   mm    w     d

CRL:     mm    w  d                  US EDC:

Subchorionic hemorrhage:  N/A

Maternal uterus/adnexae:

Retroverted uterus.

No uterine mass or gestational sac.

Endometrial complex 9 mm thick, heterogeneous, with previously seen
gestational sac no longer identified.

Small amount of heterogeneous fluid as well as echogenic material is
seen within the endometrial canal likely representing a combination
of blood and products of conception.

RIGHT ovary normal size and morphology, 2.8 x 3.3 x 2.1 cm.

LEFT ovary normal size and morphology 2.9 x 2.2 x 1.5 cm.

No free pelvic fluid or adnexal masses.
IMPRESSION: Previously identified gestational sac is no longer seen consistent
with failed pregnancy.

Heterogeneous material consisting of a fluid, complex fluid and
echogenic material is identified within the endometrial canal likely
representing a combination of products of conception and blood.

## 2022-01-07 DIAGNOSIS — H5213 Myopia, bilateral: Secondary | ICD-10-CM | POA: Diagnosis not present

## 2022-04-19 ENCOUNTER — Other Ambulatory Visit: Payer: Self-pay

## 2022-04-19 ENCOUNTER — Ambulatory Visit (INDEPENDENT_AMBULATORY_CARE_PROVIDER_SITE_OTHER): Payer: Medicaid Other | Admitting: Obstetrics and Gynecology

## 2022-04-19 ENCOUNTER — Encounter: Payer: Self-pay | Admitting: Obstetrics and Gynecology

## 2022-04-19 VITALS — BP 117/76 | HR 70 | Ht 65.0 in | Wt 172.7 lb

## 2022-04-19 DIAGNOSIS — Z3046 Encounter for surveillance of implantable subdermal contraceptive: Secondary | ICD-10-CM | POA: Diagnosis not present

## 2022-04-19 NOTE — Progress Notes (Signed)
     GYNECOLOGY OFFICE PROCEDURE NOTE  Courtney Howard is a 31 y.o. [phone] here for Nexplanon removal.  Last pap smear was on 5/21 and was normal.  No other gynecologic concerns.    Nexplanon Removal Patient identified, informed consent performed, consent signed.   Appropriate time out taken. Nexplanon site identified.  Area prepped in usual sterile fashon. One ml of 1% lidocaine was used to anesthetize the area at the distal end of the implant. A small stab incision was made right beside the implant on the distal portion.  The Nexplanon rod was grasped using hemostats and removed without difficulty.  There was minimal blood loss. There were no complications.  3 ml of 1% lidocaine was injected around the incision for post-procedure analgesia.  Steri-strips were applied over the small incision.  A pressure bandage was applied to reduce any bruising.  The patient tolerated the procedure well and was given post procedure instructions.  Patient is considering the contraceptive patch  for contraception.  She will discuss at her annual exam in 3 months as she will need a female provider for the pap smear.    Lynnda Shields, MD, [place] [facility], [place]

## 2022-08-11 ENCOUNTER — Ambulatory Visit: Payer: BC Managed Care – PPO | Admitting: Certified Nurse Midwife

## 2022-09-01 ENCOUNTER — Ambulatory Visit: Payer: BLUE CROSS/BLUE SHIELD | Admitting: Obstetrics and Gynecology

## 2022-11-09 ENCOUNTER — Other Ambulatory Visit (HOSPITAL_COMMUNITY)
Admission: RE | Admit: 2022-11-09 | Discharge: 2022-11-09 | Disposition: A | Discharge status: Home / Self Care | Payer: BLUE CROSS/BLUE SHIELD | Source: Ambulatory Visit | Attending: Certified Nurse Midwife | Admitting: Certified Nurse Midwife

## 2022-11-09 ENCOUNTER — Encounter: Payer: Self-pay | Admitting: Obstetrics and Gynecology

## 2022-11-09 ENCOUNTER — Ambulatory Visit: Payer: BLUE CROSS/BLUE SHIELD | Admitting: Obstetrics and Gynecology

## 2022-11-09 ENCOUNTER — Other Ambulatory Visit: Payer: Self-pay

## 2022-11-09 VITALS — BP 121/75 | HR 75 | Wt 175.7 lb

## 2022-11-09 DIAGNOSIS — Z124 Encounter for screening for malignant neoplasm of cervix: Secondary | ICD-10-CM | POA: Diagnosis present

## 2022-11-09 DIAGNOSIS — Z01419 Encounter for gynecological examination (general) (routine) without abnormal findings: Secondary | ICD-10-CM | POA: Diagnosis not present

## 2022-11-09 NOTE — Progress Notes (Signed)
ANNUAL EXAM Patient name: Courtney Howard MRN [id number]  Date of birth: 04-01-1990 Chief Complaint:   Gynecologic Exam  History of Present Illness:   Courtney Howard is a 32 y.o. [id number] being seen today for a routine annual exam.  Current complaints: annual   Menstrual concerns? No  regular monthly cycles Breast or nipple changes? No  Contraception use? No distance (husband currently traveling), not trying to get pregnant currently  Sexually active? Yes   No FH of ovarian, breast, and colon cancer  Patient's last menstrual period was 10/07/2022.   The pregnancy intention screening data noted above was reviewed. Potential methods of contraception were discussed. The patient elected to proceed with abstinence.   Last pap     Component Value Date/Time   DIAGPAP  07/10/2019 1548    - Negative for intraepithelial lesion or malignancy (NILM)   HPVHIGH Negative 07/10/2019 1548   ADEQPAP  07/10/2019 1548    Satisfactory for evaluation; transformation zone component PRESENT.     H/O abnormal pap: no Last mammogram: n/a.  Last colonoscopy: n/a.      03/06/2020    2:02 PM 06/11/2019    3:52 PM 02/20/2018    4:41 PM 09/20/2016   10:07 AM 08/30/2016    3:09 PM  Depression screen PHQ 2/9  Decreased Interest 0 0 0 0 0  Down, Depressed, Hopeless 0 0 0 0 0  PHQ - 2 Score 0 0 0 0 0  Altered sleeping 0 0 0    Tired, decreased energy 0 0 1    Change in appetite 0 0 1    Feeling bad or failure about yourself  0 0 0    Trouble concentrating 0 0 0    Moving slowly or fidgety/restless 0 0 0    Suicidal thoughts 0 0 0    PHQ-9 Score 0 0 2          03/06/2020    2:02 PM 06/11/2019    3:52 PM 02/20/2018    4:41 PM 08/30/2016    3:10 PM  GAD 7 : Generalized Anxiety Score  Nervous, Anxious, on Edge 0 0 0 0  Control/stop worrying 0 0 0 0  Worry too much - different things 0 0 0 0  Trouble relaxing 0 0 0 0  Restless 0 0 0 0  Easily annoyed or irritable 0 0 0 0  Afraid - awful might  happen 0 0 0 0  Total GAD 7 Score 0 0 0 0     Review of Systems:   Pertinent items are noted in HPI Denies any headaches, blurred vision, fatigue, shortness of breath, chest pain, abdominal pain, abnormal vaginal discharge/itching/odor/irritation, problems with periods, bowel movements, urination, or intercourse unless otherwise stated above. Pertinent History Reviewed:  Reviewed past medical,surgical, social and family history.  Reviewed problem list, medications and allergies. Physical Assessment:   Vitals:   11/09/22 0952  BP: 121/75  Pulse: 75  Weight: 175 lb 11.2 oz (79.7 kg)  Body mass index is 29.24 kg/m.        Physical Examination:   General appearance - well appearing, and in no distress  Mental status - alert, oriented to person, place, and time  Psych:  She has a normal mood and affect  Skin - warm and dry, normal color, no suspicious lesions noted  Chest - effort normal, all lung fields clear to auscultation bilaterally  Heart - normal rate and regular rhythm  Breasts - breasts  appear normal, no suspicious masses, no skin or nipple changes or  axillary nodes  Abdomen - soft, nontender, nondistended, no masses or organomegaly  Pelvic -  VULVA: normal appearing vulva with no masses, tenderness or lesions   VAGINA: normal appearing vagina with normal color and discharge, no lesions   CERVIX: normal appearing cervix without discharge or lesions, no CMT  Thin prep pap is done with HR HPV cotesting  UTERUS: uterus is felt to be normal size, shape, consistency and nontender   ADNEXA: No adnexal masses or tenderness noted.  Extremities:  No swelling or varicosities noted  Chaperone present for exam  No results found for this or any previous visit (from the past 24 hour(s)).    Assessment & Plan:  1. Well woman exam with routine gynecological exam - Cervical cancer screening: Discussed guidelines. Pap with HPV collected - GC/CT: not indicated - Birth Control:   abstinence - Breast Health: Encouraged self breast awareness/SBE. Teaching provided.  - F/U 12 months and prn   2. Screening for cervical cancer Pap collected   No orders of the defined types were placed in this encounter.   Meds: No orders of the defined types were placed in this encounter.   Follow-up: Return if symptoms worsen or fail to improve, for Annual GYN.  Lorriane Shire, MD 11/09/2022 9:56 AM

## 2022-11-12 LAB — CYTOLOGY - PAP
Comment: NEGATIVE
Diagnosis: NEGATIVE
High risk HPV: NEGATIVE
